# Patient Record
Sex: Female | Born: 1990 | Race: Black or African American | Hispanic: No | Marital: Single | Smoking: Never smoker
Health system: Southern US, Community
[De-identification: ages and names within clinical notes are randomized; demographics above are authoritative.]

## PROBLEM LIST (undated history)

## (undated) ENCOUNTER — Inpatient Hospital Stay (HOSPITAL_COMMUNITY): Payer: Self-pay

## (undated) DIAGNOSIS — Z789 Other specified health status: Secondary | ICD-10-CM

## (undated) HISTORY — PX: HERNIA REPAIR: SHX51

## (undated) HISTORY — DX: Other specified health status: Z78.9

---

## 2016-05-01 DIAGNOSIS — E669 Obesity, unspecified: Secondary | ICD-10-CM | POA: Insufficient documentation

## 2017-11-20 ENCOUNTER — Ambulatory Visit: Payer: Self-pay | Admitting: *Deleted

## 2017-11-20 ENCOUNTER — Encounter: Payer: Self-pay | Admitting: General Practice

## 2017-11-20 DIAGNOSIS — Z3200 Encounter for pregnancy test, result unknown: Secondary | ICD-10-CM

## 2017-11-20 DIAGNOSIS — Z3201 Encounter for pregnancy test, result positive: Secondary | ICD-10-CM

## 2017-11-20 LAB — POCT PREGNANCY, URINE: Preg Test, Ur: POSITIVE — AB

## 2017-11-20 NOTE — Progress Notes (Signed)
Pt informed of +UPT today. Medication reconciliation completed. LMP 10/04/17.  EDD 07/11/18. Pt advised to schedule prenatal care. She should return to hospital if she develops severe abdominal cramping or heavy vaginal bleeding. She voiced understanding of all information and instructions given.

## 2017-11-20 NOTE — Progress Notes (Signed)
I have reviewed the chart and agree with nursing staff's documentation of this patient's encounter.  Vonzella NippleJulie Parnika Tweten, PA-C 11/20/2017 10:10 AM

## 2018-01-01 ENCOUNTER — Ambulatory Visit (INDEPENDENT_AMBULATORY_CARE_PROVIDER_SITE_OTHER): Payer: Medicaid Other | Admitting: Obstetrics and Gynecology

## 2018-01-01 ENCOUNTER — Encounter: Payer: Self-pay | Admitting: Obstetrics and Gynecology

## 2018-01-01 ENCOUNTER — Other Ambulatory Visit (HOSPITAL_COMMUNITY)
Admission: RE | Admit: 2018-01-01 | Discharge: 2018-01-01 | Disposition: A | Discharge status: Home / Self Care | Payer: Medicaid Other | Source: Ambulatory Visit | Attending: Obstetrics and Gynecology | Admitting: Obstetrics and Gynecology

## 2018-01-01 DIAGNOSIS — O3440 Maternal care for other abnormalities of cervix, unspecified trimester: Secondary | ICD-10-CM | POA: Diagnosis not present

## 2018-01-01 DIAGNOSIS — N870 Mild cervical dysplasia: Secondary | ICD-10-CM | POA: Insufficient documentation

## 2018-01-01 DIAGNOSIS — Z3401 Encounter for supervision of normal first pregnancy, first trimester: Secondary | ICD-10-CM | POA: Diagnosis not present

## 2018-01-01 DIAGNOSIS — Z3A00 Weeks of gestation of pregnancy not specified: Secondary | ICD-10-CM | POA: Diagnosis not present

## 2018-01-01 DIAGNOSIS — Z3400 Encounter for supervision of normal first pregnancy, unspecified trimester: Secondary | ICD-10-CM | POA: Diagnosis present

## 2018-01-01 DIAGNOSIS — Z23 Encounter for immunization: Secondary | ICD-10-CM | POA: Diagnosis not present

## 2018-01-01 LAB — POCT URINALYSIS DIP (DEVICE)
BILIRUBIN URINE: NEGATIVE
Glucose, UA: NEGATIVE mg/dL
Hgb urine dipstick: NEGATIVE
Leukocytes, UA: NEGATIVE
Nitrite: NEGATIVE
PH: 7 (ref 5.0–8.0)
Protein, ur: NEGATIVE mg/dL
SPECIFIC GRAVITY, URINE: 1.015 (ref 1.005–1.030)
Urobilinogen, UA: 0.2 mg/dL (ref 0.0–1.0)

## 2018-01-01 NOTE — Progress Notes (Signed)
New OB Note  01/01/2018   CC:  Chief Complaint  Patient presents with  . Initial Prenatal Visit    Transfer of Care Patient: no  History of Present Illness: Zoe Keller is a 27 y.o. G1P0000 at [redacted]w[redacted]d by LMP being seen today for her first obstetrical visit. Her obstetrical history is significant for nothing. This was a unplanned pregnancy. Relationship with FOB: not together. Patient does intend to breast feed. Patient unsure for contraception after completion of pregnancy. Pregnancy history fully reviewed.  Her periods were: Regular She was using no method when she conceived.  She has Negative signs or symptoms of nausea/vomiting of pregnancy. She has Negative signs or symptoms of miscarriage or preterm labor  Patient reports no complaints.  ROS: A 12-point review of systems was performed and negative, except as stated in the above HPI.   OBGYN History: OB History  Gravida Para Term Preterm AB Living  1 0 0 0 0 0  SAB TAB Ectopic Multiple Live Births  0 0 0 0 0    # Outcome Date GA Lbr Len/2nd Weight Sex Delivery Anes PTL Lv  1 Current             GYN Hx: last pap smear in 2015 was normal.   Past Medical History: Past Medical History:  Diagnosis Date  . Medical history non-contributory     Past Surgical History: Past Surgical History:  Procedure Laterality Date  . HERNIA REPAIR      Family History:  Family History  Problem Relation Age of Onset  . Diabetes Maternal Grandmother     Social History:  Social History   Socioeconomic History  . Marital status: Single    Spouse name: Not on file  . Number of children: Not on file  . Years of education: Not on file  . Highest education level: Not on file  Occupational History  . Not on file  Social Needs  . Financial resource strain: Not on file  . Food insecurity:    Worry: Not on file    Inability: Not on file  . Transportation needs:    Medical: Not on file    Non-medical: Not on file    Tobacco Use  . Smoking status: Never Smoker  . Smokeless tobacco: Never Used  Substance and Sexual Activity  . Alcohol use: Never    Frequency: Never  . Drug use: Never  . Sexual activity: Not Currently    Comment: [employer]  Lifestyle  . Physical activity:    Days per week: Not on file    Minutes per session: Not on file  . Stress: Not on file  Relationships  . Social connections:    Talks on phone: Not on file    Gets together: Not on file    Attends religious service: Not on file    Active member of club or organization: Not on file    Attends meetings of clubs or organizations: Not on file    Relationship status: Not on file  . Intimate partner violence:    Fear of current or ex partner: Not on file    Emotionally abused: Not on file    Physically abused: Not on file    Forced sexual activity: Not on file  Other Topics Concern  . Not on file  Social History Narrative  . Not on file    Allergy: Not on File  Current Outpatient Medications:  Current Outpatient Medications:  .  Prenatal Vit-Fe Fumarate-FA (MULTIVITAMIN-PRENATAL) 27-0.8 MG TABS tablet, Take 1 tablet by mouth daily at 12 noon., Disp: , Rfl:   Physical Exam:   BP 114/63   Pulse 87   Ht 5\' 3"  (1.6 m)   Wt 75.4 kg (166 lb 4.8 oz)   LMP 10/04/2017 (Exact Date)   BMI 29.46 kg/m  Body mass index is 29.46 kg/m. Contractions: Not present Vag. Bleeding: None. FHTs: 156  General appearance: Well nourished, well developed female in no acute distress.  Neck:  Supple, normal appearance, and no thyromegaly  Cardiovascular: regular rate and rhythm Respiratory:  Clear to auscultation bilateral. Normal respiratory effort Abdomen: positive bowel sounds and no masses, hernias; diffusely non tender to palpation, non distended Breasts: breasts appear normal, no suspicious masses, no skin or nipple changes or axillary nodes. Genitalia:  Normal introitus for age, no external lesions, no vaginal discharge, mucosa  pink and moist, no vaginal or cervical lesions, no vaginal atrophy, no friaility or hemorrhage, normal uterus size and position, no adnexal masses or tenderness Neuro/Psych:  Normal mood and affect.  Skin:  Warm and dry.  Lymphatic:  No inguinal lymphadenopathy.    Assessment/Plan: G1P0000 2084w5d  1. Supervision of normal first pregnancy, antepartum - [facility] AMB BABYSCRIPTS OPT IN - Culture, OB Urine - Cystic fibrosis gene test - Cytology - PAP - Hemoglobinopathy Evaluation - Obstetric Panel, Including HIV - SMN1 COPY NUMBER ANALYSIS (SMA Carrier Screen) - Flu Vaccine QUAD 36+ mos IM  Initial labs drawn. Continue prenatal vitamins. Genetic Screening discussed: ordered. Ultrasound discussed; fetal anatomic survey: requested. Will need to place order at next visit. Problem list reviewed and updated. [facility] with multiple MDs and other Advanced Practice Providers was explained to patient; also emphasized that residents, students are part of our team. Routine obstetric precautions reviewed. Return in about 1 month (around 01/29/2018) for ob visit.    Caryl AdaJazma Phelps, DO OB Fellow [facility]

## 2018-01-01 NOTE — Patient Instructions (Signed)
First Trimester of Pregnancy The first trimester of pregnancy is from week 1 until the end of week 13 (months 1 through 3). A week after a sperm fertilizes an egg, the egg will implant on the wall of the uterus. This embryo will begin to develop into a baby. Genes from you and your partner will form the baby. The female genes will determine whether the baby will be a boy or a girl. At 6-8 weeks, the eyes and face will be formed, and the heartbeat can be seen on ultrasound. At the end of 12 weeks, all the baby's organs will be formed. Now that you are pregnant, you will want to do everything you can to have a healthy baby. Two of the most important things are to get good prenatal care and to follow your health care provider's instructions. Prenatal care is all the medical care you receive before the baby's birth. This care will help prevent, find, and treat any problems during the pregnancy and childbirth. Body changes during your first trimester Your body goes through many changes during pregnancy. The changes vary from woman to woman.  You may gain or lose a couple of pounds at first.  You may feel sick to your stomach (nauseous) and you may throw up (vomit). If the vomiting is uncontrollable, call your health care provider.  You may tire easily.  You may develop headaches that can be relieved by medicines. All medicines should be approved by your health care provider.  You may urinate more often. Painful urination may mean you have a bladder infection.  You may develop heartburn as a result of your pregnancy.  You may develop constipation because certain hormones are causing the muscles that push stool through your intestines to slow down.  You may develop hemorrhoids or swollen veins (varicose veins).  Your breasts may begin to grow larger and become tender. Your nipples may stick out more, and the tissue that surrounds them (areola) may become darker.  Your gums may bleed and may be  sensitive to brushing and flossing.  Dark spots or blotches (chloasma, mask of pregnancy) may develop on your face. This will likely fade after the baby is born.  Your menstrual periods will stop.  You may have a loss of appetite.  You may develop cravings for certain kinds of food.  You may have changes in your emotions from day to day, such as being excited to be pregnant or being concerned that something may go wrong with the pregnancy and baby.  You may have more vivid and strange dreams.  You may have changes in your hair. These can include thickening of your hair, rapid growth, and changes in texture. Some women also have hair loss during or after pregnancy, or hair that feels dry or thin. Your hair will most likely return to normal after your baby is born.  What to expect at prenatal visits During a routine prenatal visit:  You will be weighed to make sure you and the baby are growing normally.  Your blood pressure will be taken.  Your abdomen will be measured to track your baby's growth.  The fetal heartbeat will be listened to between weeks 10 and 14 of your pregnancy.  Test results from any previous visits will be discussed.  Your health care provider may ask you:  How you are feeling.  If you are feeling the baby move.  If you have had any abnormal symptoms, such as leaking fluid, bleeding, severe headaches,   or abdominal cramping.  If you are using any tobacco products, including cigarettes, chewing tobacco, and electronic cigarettes.  If you have any questions.  Other tests that may be performed during your first trimester include:  Blood tests to find your blood type and to check for the presence of any previous infections. The tests will also be used to check for low iron levels (anemia) and protein on red blood cells (Rh antibodies). Depending on your risk factors, or if you previously had diabetes during pregnancy, you may have tests to check for high blood  sugar that affects pregnant women (gestational diabetes).  Urine tests to check for infections, diabetes, or protein in the urine.  An ultrasound to confirm the proper growth and development of the baby.  Fetal screens for spinal cord problems (spina bifida) and Down syndrome.  HIV (human immunodeficiency virus) testing. Routine prenatal testing includes screening for HIV, unless you choose not to have this test.  You may need other tests to make sure you and the baby are doing well.  Follow these instructions at home: Medicines  Follow your health care provider's instructions regarding medicine use. Specific medicines may be either safe or unsafe to take during pregnancy.  Take a prenatal vitamin that contains at least 600 micrograms (mcg) of folic acid.  If you develop constipation, try taking a stool softener if your health care provider approves. Eating and drinking  Eat a balanced diet that includes fresh fruits and vegetables, whole grains, good sources of protein such as meat, eggs, or tofu, and low-fat dairy. Your health care provider will help you determine the amount of weight gain that is right for you.  Avoid raw meat and uncooked cheese. These carry germs that can cause birth defects in the baby.  Eating four or five small meals rather than three large meals a day may help relieve nausea and vomiting. If you start to feel nauseous, eating a few soda crackers can be helpful. Drinking liquids between meals, instead of during meals, also seems to help ease nausea and vomiting.  Limit foods that are high in fat and processed sugars, such as fried and sweet foods.  To prevent constipation: ? Eat foods that are high in fiber, such as fresh fruits and vegetables, whole grains, and beans. ? Drink enough fluid to keep your urine clear or pale yellow. Activity  Exercise only as directed by your health care provider. Most women can continue their usual exercise routine during  pregnancy. Try to exercise for 30 minutes at least 5 days a week. Exercising will help you: ? Control your weight. ? Stay in shape. ? Be prepared for labor and delivery.  Experiencing pain or cramping in the lower abdomen or lower back is a good sign that you should stop exercising. Check with your health care provider before continuing with normal exercises.  Try to avoid standing for long periods of time. Move your legs often if you must stand in one place for a long time.  Avoid heavy lifting.  Wear low-heeled shoes and practice good posture.  You may continue to have sex unless your health care provider tells you not to. Relieving pain and discomfort  Wear a good support bra to relieve breast tenderness.  Take warm sitz baths to soothe any pain or discomfort caused by hemorrhoids. Use hemorrhoid cream if your health care provider approves.  Rest with your legs elevated if you have leg cramps or low back pain.  If you develop   varicose veins in your legs, wear support hose. Elevate your feet for 15 minutes, 3-4 times a day. Limit salt in your diet. Prenatal care  Schedule your prenatal visits by the twelfth week of pregnancy. They are usually scheduled monthly at first, then more often in the last 2 months before delivery.  Write down your questions. Take them to your prenatal visits.  Keep all your prenatal visits as told by your health care provider. This is important. Safety  Wear your seat belt at all times when driving.  Make a list of emergency phone numbers, including numbers for family, friends, the hospital, and police and fire departments. General instructions  Ask your health care provider for a referral to a local prenatal education class. Begin classes no later than the beginning of month 6 of your pregnancy.  Ask for help if you have counseling or nutritional needs during pregnancy. Your health care provider can offer advice or refer you to specialists for help  with various needs.  Do not use hot tubs, steam rooms, or saunas.  Do not douche or use tampons or scented sanitary pads.  Do not cross your legs for long periods of time.  Avoid cat litter boxes and soil used by cats. These carry germs that can cause birth defects in the baby and possibly loss of the fetus by miscarriage or stillbirth.  Avoid all smoking, herbs, alcohol, and medicines not prescribed by your health care provider. Chemicals in these products affect the formation and growth of the baby.  Do not use any products that contain nicotine or tobacco, such as cigarettes and e-cigarettes. If you need help quitting, ask your health care provider. You may receive counseling support and other resources to help you quit.  Schedule a dentist appointment. At home, brush your teeth with a soft toothbrush and be gentle when you floss. Contact a health care provider if:  You have dizziness.  You have mild pelvic cramps, pelvic pressure, or nagging pain in the abdominal area.  You have persistent nausea, vomiting, or diarrhea.  You have a bad smelling vaginal discharge.  You have pain when you urinate.  You notice increased swelling in your face, hands, legs, or ankles.  You are exposed to fifth disease or chickenpox.  You are exposed to German measles (rubella) and have never had it. Get help right away if:  You have a fever.  You are leaking fluid from your vagina.  You have spotting or bleeding from your vagina.  You have severe abdominal cramping or pain.  You have rapid weight gain or loss.  You vomit blood or material that looks like coffee grounds.  You develop a severe headache.  You have shortness of breath.  You have any kind of trauma, such as from a fall or a car accident. Summary  The first trimester of pregnancy is from week 1 until the end of week 13 (months 1 through 3).  Your body goes through many changes during pregnancy. The changes vary from  woman to woman.  You will have routine prenatal visits. During those visits, your health care provider will examine you, discuss any test results you may have, and talk with you about how you are feeling. This information is not intended to replace advice given to you by your health care provider. Make sure you discuss any questions you have with your health care provider. Document Released: 08/22/2001 Document Revised: 08/09/2016 Document Reviewed: 08/09/2016 Elsevier Interactive Patient Education  2018 [employer]  Safe Medications in Pregnancy   Acne: Benzoyl Peroxide Salicylic Acid  Backache/Headache: Tylenol: 2 regular strength every 4 hours OR              2 Extra strength every 6 hours  Colds/Coughs/Allergies: Benadryl (alcohol free) 25 mg every 6 hours as needed Breath right strips Claritin Cepacol throat lozenges Chloraseptic throat spray Cold-Eeze- up to three times per day Cough drops, alcohol free Flonase (by prescription only) Guaifenesin Mucinex Robitussin DM (plain only, alcohol free) Saline nasal spray/drops Sudafed (pseudoephedrine) & Actifed ** use only after [redacted] weeks gestation and if you do not have high blood pressure Tylenol Vicks Vaporub Zinc lozenges Zyrtec   Constipation: Colace Ducolax suppositories Fleet enema Glycerin suppositories Metamucil Milk of magnesia Miralax Senokot Smooth move tea  Diarrhea: Kaopectate Imodium A-D  *NO pepto Bismol  Hemorrhoids: Anusol Anusol HC Preparation H Tucks  Indigestion: Tums Maalox Mylanta Zantac  Pepcid  Insomnia: Benadryl (alcohol free) 25mg  every 6 hours as needed Tylenol PM Unisom, no Gelcaps  Leg Cramps: Tums MagGel  Nausea/Vomiting:  Bonine Dramamine Emetrol Ginger extract Sea bands Meclizine  Nausea medication to take during pregnancy:  Unisom (doxylamine succinate 25 mg tablets) Take one tablet daily at bedtime. If symptoms are not adequately controlled, the dose  can be increased to a maximum recommended dose of two tablets daily (1/2 tablet in the morning, 1/2 tablet mid-afternoon and one at bedtime). Vitamin B6 100mg  tablets. Take one tablet twice a day (up to 200 mg per day).  Skin Rashes: Aveeno products Benadryl cream or 25mg  every 6 hours as needed Calamine Lotion 1% cortisone cream  Yeast infection: Gyne-lotrimin 7 Monistat 7   **If taking multiple medications, please check labels to avoid duplicating the same active ingredients **take medication as directed on the label ** Do not exceed 4000 mg of tylenol in 24 hours **Do not take medications that contain aspirin or ibuprofen   Contraception Choices Contraception, also called birth control, refers to methods or devices that prevent pregnancy. Hormonal methods Contraceptive implant A contraceptive implant is a thin, plastic tube that contains a hormone. It is inserted into the upper part of the arm. It can remain in place for up to 3 years. Progestin-only injections Progestin-only injections are injections of progestin, a synthetic form of the hormone progesterone. They are given every 3 months by a health care provider. Birth control pills Birth control pills are pills that contain hormones that prevent pregnancy. They must be taken once a day, preferably at the same time each day. Birth control patch The birth control patch contains hormones that prevent pregnancy. It is placed on the skin and must be changed once a week for three weeks and removed on the fourth week. A prescription is needed to use this method of contraception. Vaginal ring A vaginal ring contains hormones that prevent pregnancy. It is placed in the vagina for three weeks and removed on the fourth week. After that, the process is repeated with a new ring. A prescription is needed to use this method of contraception. Emergency contraceptive Emergency contraceptives prevent pregnancy after unprotected sex. They come  in pill form and can be taken up to 5 days after sex. They work best the sooner they are taken after having sex. Most emergency contraceptives are available without a prescription. This method should not be used as your only form of birth control. Barrier methods Female condom A female condom is a thin sheath that is worn over the penis  during sex. Condoms keep sperm from going inside a woman's body. They can be used with a spermicide to increase their effectiveness. They should be disposed after a single use. Female condom A female condom is a soft, loose-fitting sheath that is put into the vagina before sex. The condom keeps sperm from going inside a woman's body. They should be disposed after a single use. Diaphragm A diaphragm is a soft, dome-shaped barrier. It is inserted into the vagina before sex, along with a spermicide. The diaphragm blocks sperm from entering the uterus, and the spermicide kills sperm. A diaphragm should be left in the vagina for 6-8 hours after sex and removed within 24 hours. A diaphragm is prescribed and fitted by a health care provider. A diaphragm should be replaced every 1-2 years, after giving birth, after gaining more than 15 lb (6.8 kg), and after pelvic surgery. Cervical cap A cervical cap is a round, soft latex or plastic cup that fits over the cervix. It is inserted into the vagina before sex, along with spermicide. It blocks sperm from entering the uterus. The cap should be left in place for 6-8 hours after sex and removed within 48 hours. A cervical cap must be prescribed and fitted by a health care provider. It should be replaced every 2 years. Sponge A sponge is a soft, circular piece of polyurethane foam with spermicide on it. The sponge helps block sperm from entering the uterus, and the spermicide kills sperm. To use it, you make it wet and then insert it into the vagina. It should be inserted before sex, left in for at least 6 hours after sex, and removed and  thrown away within 30 hours. Spermicides Spermicides are chemicals that kill or block sperm from entering the cervix and uterus. They can come as a cream, jelly, suppository, foam, or tablet. A spermicide should be inserted into the vagina with an applicator at least 10-15 minutes before sex to allow time for it to work. The process must be repeated every time you have sex. Spermicides do not require a prescription. Intrauterine contraception Intrauterine device (IUD) An IUD is a T-shaped device that is put in a woman's uterus. There are two types:  Hormone IUD.This type contains progestin, a synthetic form of the hormone progesterone. This type can stay in place for 3-5 years.  Copper IUD.This type is wrapped in copper wire. It can stay in place for 10 years.  Permanent methods of contraception Female tubal ligation In this method, a woman's fallopian tubes are sealed, tied, or blocked during surgery to prevent eggs from traveling to the uterus. Hysteroscopic sterilization In this method, a small, flexible insert is placed into each fallopian tube. The inserts cause scar tissue to form in the fallopian tubes and block them, so sperm cannot reach an egg. The procedure takes about 3 months to be effective. Another form of birth control must be used during those 3 months. Female sterilization This is a procedure to tie off the tubes that carry sperm (vasectomy). After the procedure, the man can still ejaculate fluid (semen). Natural planning methods Natural family planning In this method, a couple does not have sex on days when the woman could become pregnant. Calendar method This means keeping track of the length of each menstrual cycle, identifying the days when pregnancy can happen, and not having sex on those days. Ovulation method In this method, a couple avoids sex during ovulation. Symptothermal method This method involves not having sex during ovulation.  The woman typically checks for  ovulation by watching changes in her temperature and in the consistency of cervical mucus. Post-ovulation method In this method, a couple waits to have sex until after ovulation. Summary  Contraception, also called birth control, means methods or devices that prevent pregnancy.  Hormonal methods of contraception include implants, injections, pills, patches, vaginal rings, and emergency contraceptives.  Barrier methods of contraception can include female condoms, female condoms, diaphragms, cervical caps, sponges, and spermicides.  There are two types of IUDs (intrauterine devices). An IUD can be put in a woman's uterus to prevent pregnancy for 3-5 years.  Permanent sterilization can be done through a procedure for males, females, or both.  Natural family planning methods involve not having sex on days when the woman could become pregnant. This information is not intended to replace advice given to you by your health care provider. Make sure you discuss any questions you have with your health care provider. Document Released: 08/28/2005 Document Revised: 09/30/2016 Document Reviewed: 09/30/2016 Elsevier Interactive Patient Education  2018 [employer]

## 2018-01-04 LAB — URINE CULTURE, OB REFLEX: Organism ID, Bacteria: NO GROWTH

## 2018-01-04 LAB — CYTOLOGY - PAP
Chlamydia: NEGATIVE
Neisseria Gonorrhea: NEGATIVE

## 2018-01-04 LAB — CULTURE, OB URINE

## 2018-01-08 ENCOUNTER — Encounter: Payer: Self-pay | Admitting: Obstetrics and Gynecology

## 2018-01-08 DIAGNOSIS — R87612 Low grade squamous intraepithelial lesion on cytologic smear of cervix (LGSIL): Secondary | ICD-10-CM | POA: Insufficient documentation

## 2018-01-09 LAB — SMN1 COPY NUMBER ANALYSIS (SMA CARRIER SCREENING)

## 2018-01-09 LAB — OBSTETRIC PANEL, INCLUDING HIV
Antibody Screen: NEGATIVE
Basophils Absolute: 0 10*3/uL (ref 0.0–0.2)
Basos: 0 %
EOS (ABSOLUTE): 0.1 10*3/uL (ref 0.0–0.4)
EOS: 1 %
HIV Screen 4th Generation wRfx: NONREACTIVE
Hematocrit: 36 % (ref 34.0–46.6)
Hemoglobin: 12.1 g/dL (ref 11.1–15.9)
Hepatitis B Surface Ag: NEGATIVE
IMMATURE GRANULOCYTES: 1 %
Immature Grans (Abs): 0.1 10*3/uL (ref 0.0–0.1)
LYMPHS: 17 %
Lymphocytes Absolute: 1.7 10*3/uL (ref 0.7–3.1)
MCH: 31 pg (ref 26.6–33.0)
MCHC: 33.6 g/dL (ref 31.5–35.7)
MCV: 92 fL (ref 79–97)
MONOS ABS: 0.6 10*3/uL (ref 0.1–0.9)
Monocytes: 6 %
NEUTROS PCT: 75 %
Neutrophils Absolute: 7.6 10*3/uL — ABNORMAL HIGH (ref 1.4–7.0)
PLATELETS: 396 10*3/uL — AB (ref 150–379)
RBC: 3.9 x10E6/uL (ref 3.77–5.28)
RDW: 13.4 % (ref 12.3–15.4)
RH TYPE: POSITIVE
RPR Ser Ql: NONREACTIVE
Rubella Antibodies, IGG: 3.11 index (ref >0.99)
WBC: 10.1 10*3/uL (ref 3.4–10.8)

## 2018-01-09 LAB — HEMOGLOBINOPATHY EVALUATION
Ferritin: 75 ng/mL (ref 15–150)
HGB A2 QUANT: 2.7 % (ref 1.8–3.2)
HGB A: 97.3 % (ref 96.4–98.8)
HGB C: 0 %
HGB S: 0 %
HGB VARIANT: 0 %
Hgb F Quant: 0 % (ref 0.0–2.0)
Hgb Solubility: NEGATIVE

## 2018-01-09 LAB — CYSTIC FIBROSIS GENE TEST

## 2018-01-10 ENCOUNTER — Encounter: Payer: Self-pay | Admitting: *Deleted

## 2018-01-16 ENCOUNTER — Encounter: Payer: Self-pay | Admitting: *Deleted

## 2018-01-25 ENCOUNTER — Other Ambulatory Visit: Payer: Self-pay

## 2018-01-25 ENCOUNTER — Encounter (HOSPITAL_COMMUNITY): Payer: Self-pay

## 2018-01-25 ENCOUNTER — Inpatient Hospital Stay (HOSPITAL_COMMUNITY)
Admission: AD | Admit: 2018-01-25 | Discharge: 2018-01-25 | Disposition: A | Discharge status: Home / Self Care | Payer: Medicaid Other | Source: Ambulatory Visit | Attending: Obstetrics and Gynecology | Admitting: Obstetrics and Gynecology

## 2018-01-25 DIAGNOSIS — R109 Unspecified abdominal pain: Secondary | ICD-10-CM | POA: Diagnosis not present

## 2018-01-25 DIAGNOSIS — Z3A16 16 weeks gestation of pregnancy: Secondary | ICD-10-CM | POA: Diagnosis not present

## 2018-01-25 DIAGNOSIS — O26892 Other specified pregnancy related conditions, second trimester: Secondary | ICD-10-CM | POA: Diagnosis not present

## 2018-01-25 DIAGNOSIS — N949 Unspecified condition associated with female genital organs and menstrual cycle: Secondary | ICD-10-CM | POA: Diagnosis not present

## 2018-01-25 LAB — URINALYSIS, ROUTINE W REFLEX MICROSCOPIC
BILIRUBIN URINE: NEGATIVE
GLUCOSE, UA: NEGATIVE mg/dL
Hgb urine dipstick: NEGATIVE
KETONES UR: NEGATIVE mg/dL
Leukocytes, UA: NEGATIVE
Nitrite: NEGATIVE
Protein, ur: NEGATIVE mg/dL
Specific Gravity, Urine: 1.032 — ABNORMAL HIGH (ref 1.005–1.030)
pH: 6 (ref 5.0–8.0)

## 2018-01-25 NOTE — Progress Notes (Addendum)
  G1 @ 16.[redacted] wksga. Here dt to pain on the left side 2/10 that comes and goes. Denies bleeding or LOF. States feels some flutter "but unsure"   1131: Provider at bs assessing.   1140: d/c orders received.  D/c instructions given with pt understanding. Pt left unit via ambulatory.

## 2018-01-25 NOTE — Discharge Instructions (Signed)

## 2018-01-25 NOTE — [facility]U Provider Note (Signed)
History     CSN: [id number]  Arrival date and time: 01/25/18 1030   First Provider Initiated Contact with Patient 01/25/18 1130      Chief Complaint  Patient presents with  . Abdominal Pain   HPI Zoe Keller is a 27 y.o. G1P0000 at [redacted]w[redacted]d who presents to [facility]U today with complaint of LLQ abdominal pain off and on x 1 week. She states that it is mild and feels more like pressure with standing and walking. Pain is often relieved with rest. She denies vaginal bleeding, discharge, fever or UTI symptoms. She has not taken any pain medications.   OB History    Gravida  1   Para  0   Term  0   Preterm  0   AB  0   Living  0     SAB  0   TAB  0   Ectopic  0   Multiple  0   Live Births  0           Past Medical History:  Diagnosis Date  . Medical history non-contributory     Past Surgical History:  Procedure Laterality Date  . HERNIA REPAIR      Family History  Problem Relation Age of Onset  . Diabetes Maternal Grandmother     Social History   Tobacco Use  . Smoking status: Never Smoker  . Smokeless tobacco: Never Used  Substance Use Topics  . Alcohol use: Never    Frequency: Never  . Drug use: Never    Allergies: No Known Allergies  Medications Prior to Admission  Medication Sig Dispense Refill Last Dose  . Prenatal Vit-Fe Fumarate-FA (MULTIVITAMIN-PRENATAL) 27-0.8 MG TABS tablet Take 1 tablet by mouth daily at 12 noon.   Taking    Review of Systems  Constitutional: Negative for fever.  Gastrointestinal: Positive for abdominal pain. Negative for constipation, diarrhea, nausea and vomiting.  Genitourinary: Negative for dysuria, frequency, urgency, vaginal bleeding and vaginal discharge.   Physical Exam   Blood pressure 120/66, pulse 90, temperature 98 F (36.7 C), temperature source Oral, resp. rate 18, height  (1.6 m), weight 173 lb (78.5 kg), last menstrual period 10/04/2017, SpO2 99 %.  Physical Exam  Nursing note and  vitals reviewed. Constitutional: She is oriented to person, place, and time. She appears well-developed and well-nourished. No distress.  HENT:  Head: Normocephalic and atraumatic.  Cardiovascular: Normal rate.  Respiratory: Effort normal.  GI: Soft. She exhibits no distension and no mass. There is no tenderness. There is no rebound and no guarding.  Neurological: She is alert and oriented to person, place, and time.  Skin: Skin is warm and dry. No erythema.  Psychiatric: She has a normal mood and affect.  Dilation: Closed Effacement (%): Thick Cervical Position: Posterior Exam by:: Vonzella Nipple, PA-C   Results for orders placed or performed during the hospital encounter of 01/25/18 (from the past 24 hour(s))  Urinalysis, Routine w reflex microscopic     Status: Abnormal   Collection Time: 01/25/18 10:40 AM  Result Value Ref Range   Color, Urine YELLOW YELLOW   APPearance HAZY (A) CLEAR   Specific Gravity, Urine 1.032 (H) 1.005 - 1.030   pH 6.0 5.0 - 8.0   Glucose, UA NEGATIVE NEGATIVE mg/dL   Hgb urine dipstick NEGATIVE NEGATIVE   Bilirubin Urine NEGATIVE NEGATIVE   Ketones, ur NEGATIVE NEGATIVE mg/dL   Protein, ur NEGATIVE NEGATIVE mg/dL   Nitrite NEGATIVE NEGATIVE   Leukocytes,  UA NEGATIVE NEGATIVE    MAU Course  Procedures None  MDM FHR - 148 bpm with doppler  UA today  Assessment and Plan  A: [facility]UP at [redacted]w[redacted]d Round ligament pain   P: Discharge home Tylenol PRN for pain advised  Discussed abdominal binder, moderation of activity and warm bath/shower or hydrotherapy  Second trimester precautions discussed Patient advised to follow-up with [facility] for routine prenatal care or sooner PRN  Patient may return to MAU as needed or if her condition were to change or worsen  Vonzella Nipple, PA-C 01/25/2018, 11:30 AM

## 2018-01-25 NOTE — MAU Note (Signed)
Urine in lab 

## 2018-02-01 ENCOUNTER — Other Ambulatory Visit: Payer: Self-pay

## 2018-02-01 ENCOUNTER — Ambulatory Visit (INDEPENDENT_AMBULATORY_CARE_PROVIDER_SITE_OTHER): Payer: Medicaid Other | Admitting: Obstetrics and Gynecology

## 2018-02-01 VITALS — BP 109/69 | HR 99 | Wt 176.0 lb

## 2018-02-01 DIAGNOSIS — R87612 Low grade squamous intraepithelial lesion on cytologic smear of cervix (LGSIL): Secondary | ICD-10-CM

## 2018-02-01 DIAGNOSIS — O2602 Excessive weight gain in pregnancy, second trimester: Secondary | ICD-10-CM | POA: Insufficient documentation

## 2018-02-01 DIAGNOSIS — Z3400 Encounter for supervision of normal first pregnancy, unspecified trimester: Secondary | ICD-10-CM

## 2018-02-01 NOTE — Patient Instructions (Signed)
Second Trimester of Pregnancy The second trimester is from week 13 through week 28, month 4 through 6. This is often the time in pregnancy that you feel your best. Often times, morning sickness has lessened or quit. You may have more energy, and you may get hungry more often. Your unborn baby (fetus) is growing rapidly. At the end of the sixth month, he or she is about 9 inches long and weighs about 1 pounds. You will likely feel the baby move (quickening) between 18 and 20 weeks of pregnancy. Follow these instructions at home:  Avoid all smoking, herbs, and alcohol. Avoid drugs not approved by your doctor.  Do not use any tobacco products, including cigarettes, chewing tobacco, and electronic cigarettes. If you need help quitting, ask your doctor. You may get counseling or other support to help you quit.  Only take medicine as told by your doctor. Some medicines are safe and some are not during pregnancy.  Exercise only as told by your doctor. Stop exercising if you start having cramps.  Eat regular, healthy meals.  Wear a good support bra if your breasts are tender.  Do not use hot tubs, steam rooms, or saunas.  Wear your seat belt when driving.  Avoid raw meat, uncooked cheese, and liter boxes and soil used by cats.  Take your prenatal vitamins.  Take 1500-2000 milligrams of calcium daily starting at the 20th week of pregnancy until you deliver your baby.  Try taking medicine that helps you poop (stool softener) as needed, and if your doctor approves. Eat more fiber by eating fresh fruit, vegetables, and whole grains. Drink enough fluids to keep your pee (urine) clear or pale yellow.  Take warm water baths (sitz baths) to soothe pain or discomfort caused by hemorrhoids. Use hemorrhoid cream if your doctor approves.  If you have puffy, bulging veins (varicose veins), wear support hose. Raise (elevate) your feet for 15 minutes, 3-4 times a day. Limit salt in your diet.  Avoid heavy  lifting, wear low heals, and sit up straight.  Rest with your legs raised if you have leg cramps or low back pain.  Visit your dentist if you have not gone during your pregnancy. Use a soft toothbrush to brush your teeth. Be gentle when you floss.  You can have sex (intercourse) unless your doctor tells you not to.  Go to your doctor visits. Get help if:  You feel dizzy.  You have mild cramps or pressure in your lower belly (abdomen).  You have a nagging pain in your belly area.  You continue to feel sick to your stomach (nauseous), throw up (vomit), or have watery poop (diarrhea).  You have bad smelling fluid coming from your vagina.  You have pain with peeing (urination). Get help right away if:  You have a fever.  You are leaking fluid from your vagina.  You have spotting or bleeding from your vagina.  You have severe belly cramping or pain.  You lose or gain weight rapidly.  You have trouble catching your breath and have chest pain.  You notice sudden or extreme puffiness (swelling) of your face, hands, ankles, feet, or legs.  You have not felt the baby move in over an hour.  You have severe headaches that do not go away with medicine.  You have vision changes. This information is not intended to replace advice given to you by your health care provider. Make sure you discuss any questions you have with your health care   provider. Document Released: 11/22/2009 Document Revised: 02/03/2016 Document Reviewed: 10/29/2012 Elsevier Interactive Patient Education  2017 [employer]  

## 2018-02-01 NOTE — Progress Notes (Signed)
Subjective:  Zoe Keller is a 27 y.o. G1P0000 at [redacted]w[redacted]d being seen today for ongoing prenatal care.  She is currently monitored for the following issues for this low-risk pregnancy and has Supervision of normal first pregnancy, antepartum and Low grade squamous intraepithelial lesion (LGSIL) on cervical Pap smear on their problem list.  Patient reports no complaints.  Contractions: Not present. Vag. Bleeding: None.  Movement: Present. Denies leaking of fluid.   The following portions of the patient's history were reviewed and updated as appropriate: allergies, current medications, past family history, past medical history, past social history, past surgical history and problem list. Problem list updated.  Objective:   Vitals:   02/01/18 1008  BP: 109/69  Pulse: 99  Weight: 176 lb (79.8 kg)    Fetal Status: Fetal Heart Rate (bpm): 154   Movement: Present     General:  Alert, oriented and cooperative. Patient is in no acute distress.  Skin: Skin is warm and dry. No rash noted.   Cardiovascular: Normal heart rate noted  Respiratory: Normal respiratory effort, no problems with respiration noted  Abdomen: Soft, gravid, appropriate for gestational age. Pain/Pressure: Absent     Pelvic: Vag. Bleeding: None     Cervical exam deferred        Extremities: Normal range of motion.  Edema: None  Mental Status: Normal mood and affect. Normal behavior. Normal judgment and thought content.   Urinalysis:      Assessment and Plan:  Pregnancy: G1P0000 at [redacted]w[redacted]d  1. Supervision of normal first pregnancy, antepartum Doing well. Routine care  2. Low grade squamous intraepithelial lesion (LGSIL) on cervical Pap smear PP colposcopy needed  3. Excessive weight gain during pregnancy in second trimester Discussed 10lb weight gain in 4 weeks and overall 26lb TWG this pregnancy. Encouraged cardio and healthy eating. Continue to monitor.    General obstetric precautions were reviewed in detail with  the patient. Please refer to After Visit Summary for other counseling recommendations.  Return in about 1 month (around 03/01/2018) for ob visit.   Pincus Large, DO

## 2018-02-07 ENCOUNTER — Encounter (HOSPITAL_COMMUNITY): Payer: Self-pay

## 2018-02-15 ENCOUNTER — Other Ambulatory Visit: Payer: Self-pay | Admitting: Obstetrics and Gynecology

## 2018-02-15 ENCOUNTER — Ambulatory Visit (HOSPITAL_COMMUNITY)
Admission: RE | Admit: 2018-02-15 | Discharge: 2018-02-15 | Disposition: A | Discharge status: Home / Self Care | Payer: Medicaid Other | Source: Ambulatory Visit | Attending: Obstetrics and Gynecology | Admitting: Obstetrics and Gynecology

## 2018-02-15 DIAGNOSIS — Z3402 Encounter for supervision of normal first pregnancy, second trimester: Secondary | ICD-10-CM | POA: Insufficient documentation

## 2018-02-15 DIAGNOSIS — Z3400 Encounter for supervision of normal first pregnancy, unspecified trimester: Secondary | ICD-10-CM

## 2018-02-15 DIAGNOSIS — Z363 Encounter for antenatal screening for malformations: Secondary | ICD-10-CM | POA: Insufficient documentation

## 2018-02-15 DIAGNOSIS — Z3A19 19 weeks gestation of pregnancy: Secondary | ICD-10-CM

## 2018-03-01 ENCOUNTER — Ambulatory Visit (INDEPENDENT_AMBULATORY_CARE_PROVIDER_SITE_OTHER): Payer: Medicaid Other | Admitting: Advanced Practice Midwife

## 2018-03-01 VITALS — BP 126/71 | HR 81 | Wt 188.0 lb

## 2018-03-01 DIAGNOSIS — R87612 Low grade squamous intraepithelial lesion on cytologic smear of cervix (LGSIL): Secondary | ICD-10-CM

## 2018-03-01 DIAGNOSIS — Z3400 Encounter for supervision of normal first pregnancy, unspecified trimester: Secondary | ICD-10-CM

## 2018-03-01 DIAGNOSIS — Z3402 Encounter for supervision of normal first pregnancy, second trimester: Secondary | ICD-10-CM

## 2018-03-01 DIAGNOSIS — O2602 Excessive weight gain in pregnancy, second trimester: Secondary | ICD-10-CM

## 2018-03-01 NOTE — Progress Notes (Addendum)
   PRENATAL VISIT NOTE  Subjective:  Zoe Keller is a 27 y.o. G1P0000 at 6193w1d being seen today for ongoing prenatal care.  She is currently monitored for the following issues for this low-risk pregnancy and has Supervision of normal first pregnancy, antepartum; Low grade squamous intraepithelial lesion (LGSIL) on cervical Pap smear; and Excessive weight gain during pregnancy in second trimester on their problem list.  Patient reports no bleeding, no contractions, no cramping and no leaking.  Contractions: Not present. Vag. Bleeding: None.  Movement: Present. Denies leaking of fluid.   The following portions of the patient's history were reviewed and updated as appropriate: allergies, current medications, past family history, past medical history, past social history, past surgical history and problem list. Problem list updated.  Objective:   Vitals:   03/01/18 0829  BP: 126/71  Pulse: 81  Weight: 188 lb (85.3 kg)    Fetal Status: Fetal Heart Rate (bpm): 145   Movement: Present     General:  Alert, oriented and cooperative. Patient is in no acute distress.  Skin: Skin is warm and dry. No rash noted.   Cardiovascular: Normal heart rate noted  Respiratory: Normal respiratory effort, no problems with respiration noted  Abdomen: Soft, gravid, appropriate for gestational age.  Pain/Pressure: Absent     Pelvic: Cervical exam deferred        Extremities: Normal range of motion.  Edema: None  Mental Status: Normal mood and affect. Normal behavior. Normal judgment and thought content.   Assessment and Plan:  Pregnancy: G1P0000 at 1893w1d  1. 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 Low grade squamous  intraepithelial lesion (LGSIL) on cervical Pap smear -Postpartum colpo  3. Excessive weight gain during pregnancy in second trimester -188lbs today, weight up 22 lbs from initial OB visit 01/01/18, 11 lb gain from 4 weeks ago -Reviewed diet plan with protein first, 64 oz minimum daily water intake - Discussed impact of weight gain on antepartum risk factors     Preterm labor symptoms and general obstetric precautions including but not limited to vaginal bleeding, contractions, leaking of fluid and fetal movement were reviewed in detail with the patient. Please refer to After Visit Summary for other counseling recommendations.  Return in about 1 month (around 03/29/2018).  No future appointments.  Calvert CantorSamantha C Weinhold, CNM  03/01/18  8:48 AM

## 2018-03-01 NOTE — Patient Instructions (Addendum)
Second Trimester of Pregnancy The second trimester is from week 13 through week 28, month 4 through 6. This is often the time in pregnancy that you feel your best. Often times, morning sickness has lessened or quit. You may have more energy, and you may get hungry more often. Your unborn baby (fetus) is growing rapidly. At the end of the sixth month, he or she is about 9 inches long and weighs about 1 pounds. You will likely feel the baby move (quickening) between 18 and 20 weeks of pregnancy. Follow these instructions at home:  Avoid all smoking, herbs, and alcohol. Avoid drugs not approved by your doctor.  Do not use any tobacco products, including cigarettes, chewing tobacco, and electronic cigarettes. If you need help quitting, ask your doctor. You may get counseling or other support to help you quit.  Only take medicine as told by your doctor. Some medicines are safe and some are not during pregnancy.  Exercise only as told by your doctor. Stop exercising if you start having cramps.  Eat regular, healthy meals.  Wear a good support bra if your breasts are tender.  Do not use hot tubs, steam rooms, or saunas.  Wear your seat belt when driving.  Avoid raw meat, uncooked cheese, and liter boxes and soil used by cats.  Take your prenatal vitamins.  Take 1500-2000 milligrams of calcium daily starting at the 20th week of pregnancy until you deliver your baby.  Try taking medicine that helps you poop (stool softener) as needed, and if your doctor approves. Eat more fiber by eating fresh fruit, vegetables, and whole grains. Drink enough fluids to keep your pee (urine) clear or pale yellow.  Take warm water baths (sitz baths) to soothe pain or discomfort caused by hemorrhoids. Use hemorrhoid cream if your doctor approves.  If you have puffy, bulging veins (varicose veins), wear support hose. Raise (elevate) your feet for 15 minutes, 3-4 times a day. Limit salt in your diet.  Avoid heavy  lifting, wear low heals, and sit up straight.  Rest with your legs raised if you have leg cramps or low back pain.  Visit your dentist if you have not gone during your pregnancy. Use a soft toothbrush to brush your teeth. Be gentle when you floss.  You can have sex (intercourse) unless your doctor tells you not to.  Go to your doctor visits. Get help if:  You feel dizzy.  You have mild cramps or pressure in your lower belly (abdomen).  You have a nagging pain in your belly area.  You continue to feel sick to your stomach (nauseous), throw up (vomit), or have watery poop (diarrhea).  You have bad smelling fluid coming from your vagina.  You have pain with peeing (urination). Get help right away if:  You have a fever.  You are leaking fluid from your vagina.  You have spotting or bleeding from your vagina.  You have severe belly cramping or pain.  You lose or gain weight rapidly.  You have trouble catching your breath and have chest pain.  You notice sudden or extreme puffiness (swelling) of your face, hands, ankles, feet, or legs.  You have not felt the baby move in over an hour.  You have severe headaches that do not go away with medicine.  You have vision changes. This information is not intended to replace advice given to you by your health care provider. Make sure you discuss any questions you have with your health care   provider. Document Released: 11/22/2009 Document Revised: 02/03/2016 Document Reviewed: 10/29/2012 Elsevier Interactive Patient Education  2017 [employer]  BENEFITS OF BREASTFEEDING Many women wonder if they should breastfeed. Research shows that breast milk contains the perfect balance of vitamins, protein and fat that your baby needs to grow. It also contains antibodies that help your baby's immune system to fight off viruses and bacteria and can reduce the risk of sudden infant death syndrome (SIDS). In addition, the colostrum (a fluid  secreted from the breast in the first few days after delivery) helps your newborn's digestive system to grow and function well. Breast milk is easier to digest than formula. Also, if your baby is born preterm, breast milk can help to reduce both short- and long-term health problems. BENEFITS OF BREASTFEEDING FOR MOM . Breastfeeding causes a hormone to be released that helps the uterus to contract and return to its normal size more quickly. . It aids in postpartum weight loss, reduces risk of breast and ovarian cancer, heart disease and rheumatoid arthritis. . It decreases the amount of bleeding after the baby is born. benefits of breastfeeding for baby . Provides comfort and nutrition . Protects baby against - Obesity - Diabetes - Asthma - Childhood cancers - Heart disease - Ear infections - Diarrhea - Pneumonia - Stomach problems - Serious allergies - Skin rashes . Promotes growth and development . Reduces the risk of baby having Sudden Infant Death Syndrome (SIDS) only breastmilk for the first 6 months . Protects baby against diseases/allergies . It's the perfect amount for tiny bellies . It restores baby's energy . Provides the best nutrition for baby . Giving water or formula can make baby more likely to get sick, decrease Mom's milk supply, make baby less content with breastfeeding Skin to Skin After delivery, the staff will place your baby on your chest. This helps with the following: . Regulates baby's temperature, breathing, heart rate and blood sugar . Increases Mom's milk supply . Promotes bonding . Keeps baby and Mom calm and decreases baby's crying Rooming In Your baby will stay in your room with you for the entire time you are in the hospital. This helps with the following: . Allows Mom to learn baby's feeding cues - Fluttering eyes - Sucking on tongue or hand - Rooting (opens mouth and turns head) - Nuzzling into the breast - Bringing hand to mouth . Allows  breastfeeding on demand (when your baby is ready) . Helps baby to be calm and content . Ensures a good milk supply . Prevents complications with breastfeeding . Allows parents to learn to care for baby . Allows you to request assistance with breastfeeding Importance of a good latch . Increases milk transfer to baby - baby gets enough milk . Ensures you have enough milk for your baby . Decreases nipple soreness . Don't use pacifiers and bottles - these cause baby to suck differently than breastfeeding . Promotes continuation of breastfeeding Risks of Formula Supplementation with Breastfeeding Giving your infant formula in addition to your breast-milk EXCEPT when medically necessary can lead to: . Decreases your milk supply  . Loss of confidence in yourself for providing baby's nutrition  . Engorgement and possibly mastitis  . Asthma & allergies in the baby BREASTFEEDING FAQS How long should I breastfeed my baby? It is recommended that you provide your baby with breast milk only for the first 6 months and then continue for the first year and longer as desired. During the first few weeks after   birth, your baby will need to feed 8-12 times every 24 hours, or every 2-3 hours. They will likely feed for 15-30 minutes. How can I help my baby begin breastfeeding? Babies are born with an instinct to breastfeed. A healthy baby can begin breastfeeding right away without specific help. At the hospital, a nurse (or lactation consultant) will help you begin the process and will give you tips on good positioning. It may be helpful to take a breastfeeding class before you deliver in order to know what to expect. How can I help my baby latch on? In order to assist your baby in latching-on, cup your breast in your hand and stroke your baby's lower lip with your nipple to stimulate your baby's rooting reflex. Your baby will look like he or she is yawning, at which point you should bring the baby towards your  breast, while aiming the nipple at the roof of his or her mouth. Remember to bring the baby towards you and not your breast towards the baby. How can I tell if my baby is latched-on? Your baby will have all of your nipple and part of the dark area around the nipple in his or her mouth and your baby's nose will be touching your breast. You should see or hear the baby swallowing. If the baby is not latched-on properly, start the process over. To remove the suction, insert a clean finger between your breast and the baby's mouth. Should I switch breasts during feeding? After feeding on one side, switch the baby to your other breast. If he or she does not continue feeding - that is OK. Your baby will not necessarily need to feed from both breasts in a single feeding. On the next feeding, start with the other breast for efficiency and comfort. How can I tell if my baby is hungry? When your baby is hungry, they will nuzzle against your breast, make sucking noises and tongue motions and may put their hands near their mouth. Crying is a late sign of hunger, so you should not wait until this point. When they have received enough milk, they will unlatch from the breast. Is it okay to use a pacifier? Until your baby gets the hang of breastfeeding, experts recommend limiting pacifier usage. If you have questions about this, please contact your pediatrician. What can I do to ensure proper nutrition while breastfeeding? . Make sure that you support your own health and your baby's by eating a healthy, well-balanced diet . Your provider may recommend that you continue to take your prenatal vitamin . Drink plenty of fluids. It is a good rule to drink one glass of water before or after feeding . Alcohol will remain in the breast milk for as long as it will remain in the blood stream. If you choose to have a drink, it is recommended that you wait at least 2 hours before feeding . Moderate amounts of caffeine are  OK . Some over-the-counter or prescription medications are not recommended during breastfeeding. Check with your provider if you have questions What types of birth control methods are safe while breastfeeding? Progestin-only methods, including a daily pill, an IUD, the implant and the injection are safe while breastfeeding. Methods that contain estrogen (such as combination birth control pills, the vaginal ring and the patch) should not be used during the first month of breastfeeding as these can decrease your milk supply. 

## 2018-03-03 LAB — AFP, SERUM, OPEN SPINA BIFIDA
AFP MoM: 0.83
AFP Value: 50.6 ng/mL
GEST. AGE ON COLLECTION DATE: 21.1 wk
Maternal Age At EDD: 26.2 yr
OSBR Risk 1 IN: 10000
TEST RESULTS AFP: NEGATIVE
Weight: 188 [lb_av]

## 2018-03-17 ENCOUNTER — Encounter: Payer: Self-pay | Admitting: Obstetrics and Gynecology

## 2018-03-29 ENCOUNTER — Encounter: Payer: Self-pay | Admitting: General Practice

## 2018-03-29 ENCOUNTER — Ambulatory Visit (INDEPENDENT_AMBULATORY_CARE_PROVIDER_SITE_OTHER): Payer: Medicaid Other | Admitting: Advanced Practice Midwife

## 2018-03-29 VITALS — BP 124/75 | HR 101 | Wt 189.6 lb

## 2018-03-29 DIAGNOSIS — O2602 Excessive weight gain in pregnancy, second trimester: Secondary | ICD-10-CM

## 2018-03-29 DIAGNOSIS — Z3402 Encounter for supervision of normal first pregnancy, second trimester: Secondary | ICD-10-CM

## 2018-03-29 DIAGNOSIS — R87612 Low grade squamous intraepithelial lesion on cytologic smear of cervix (LGSIL): Secondary | ICD-10-CM

## 2018-03-29 NOTE — Patient Instructions (Signed)

## 2018-03-29 NOTE — Progress Notes (Signed)
   PRENATAL VISIT NOTE  Subjective:  Zoe Keller is a 27 y.o. G1P0000 at 6470w1d being seen today for ongoing prenatal care.  She is currently monitored for the following issues for this low-risk pregnancy and has Supervision of normal first pregnancy, antepartum; Low grade squamous intraepithelial lesion (LGSIL) on cervical Pap smear; and Excessive weight gain during pregnancy in second trimester on their problem list.  Patient reports no complaints.  Contractions: Not present. Vag. Bleeding: None.  Movement: Present. Denies leaking of fluid.   The following portions of the patient's history were reviewed and updated as appropriate: allergies, current medications, past family history, past medical history, past social history, past surgical history and problem list. Problem list updated.  Objective:   Vitals:   03/29/18 1357  BP: 124/75  Pulse: (!) 101  Weight: 189 lb 9.6 oz (86 kg)    Fetal Status: Fetal Heart Rate (bpm): 148 Fundal Height: 27 cm Movement: Present     General:  Alert, oriented and cooperative. Patient is in no acute distress.  Skin: Skin is warm and dry. No rash noted.   Cardiovascular: Normal heart rate noted  Respiratory: Normal respiratory effort, no problems with respiration noted  Abdomen: Soft, gravid, appropriate for gestational age.  Pain/Pressure: Absent     Pelvic: Cervical exam deferred        Extremities: Normal range of motion.  Edema: None  Mental Status: Normal mood and affect. Normal behavior. Normal judgment and thought content.   Assessment and Plan:  Pregnancy: G1P0000 at 5270w1d  1. Encounter for supervision of normal first pregnancy in second trimester --No complaints today, continue routine care --Patient has scheduled childbirth and breastfeeding classes  2. Pap smear abnormality of cervix with LGSIL --Postpartum colposcopy  3. Excessive weight gain during pregnancy in second trimester --I lb 9.6oz weight gain from previous visit  four weeks ago --Encouraged to stick with current diet/exercise regimen  Preterm labor symptoms and general obstetric precautions including but not limited to vaginal bleeding, contractions, leaking of fluid and fetal movement were reviewed in detail with the patient. Please refer to After Visit Summary for other counseling recommendations.  Return in about 1 month (around 04/26/2018).  No future appointments.  Calvert CantorSamantha C Weinhold, CNM  03/29/18  2:11 PM

## 2018-04-15 ENCOUNTER — Telehealth: Payer: Self-pay

## 2018-04-15 NOTE — Telephone Encounter (Signed)
Pt called stating she was stung by a bee Sunday, she put tobacco on it to pull the stinger out, but it's still red and swollen, she took benadryl after it happened, she has an appt with Dr. Delorse LekPadgett Friday Aug 9, she states she doesn't have enough gas to get her here for two different appt dates. She wants to know what can she do.

## 2018-04-15 NOTE — Telephone Encounter (Signed)
Discussed with Lachelle verbally that she can ice the area, use topical steroids (ie. Hydrocortisone, triamcinolone) as well as antihistamine (ie. Benadryl, zyrtec/xyzal/allegra) to help with itch.    It can take days to weeks (upwards of 2 weeks) for local reactions to stings to fully resolve.

## 2018-04-16 NOTE — Telephone Encounter (Signed)
Error: Documented wrong pt chart. Copied and pasted to correct pt

## 2018-04-26 ENCOUNTER — Other Ambulatory Visit: Payer: Self-pay

## 2018-04-26 ENCOUNTER — Ambulatory Visit (INDEPENDENT_AMBULATORY_CARE_PROVIDER_SITE_OTHER): Payer: Medicaid Other | Admitting: Certified Nurse Midwife

## 2018-04-26 ENCOUNTER — Other Ambulatory Visit: Payer: Medicaid Other

## 2018-04-26 ENCOUNTER — Encounter: Payer: Self-pay | Admitting: Certified Nurse Midwife

## 2018-04-26 DIAGNOSIS — Z3403 Encounter for supervision of normal first pregnancy, third trimester: Secondary | ICD-10-CM

## 2018-04-26 DIAGNOSIS — Z3400 Encounter for supervision of normal first pregnancy, unspecified trimester: Secondary | ICD-10-CM

## 2018-04-26 NOTE — Progress Notes (Signed)
Pt was not fasting today.  2hr GTT and 28 wk labs will be done next week.

## 2018-04-26 NOTE — Patient Instructions (Addendum)
Safe Medications in Pregnancy   Acne: Benzoyl Peroxide Salicylic Acid  Backache/Headache: Tylenol: 2 regular strength every 4 hours OR              2 Extra strength every 6 hours  Colds/Coughs/Allergies: Benadryl (alcohol free) 25 mg every 6 hours as needed Breath right strips Claritin Cepacol throat lozenges Chloraseptic throat spray Cold-Eeze- up to three times per day Cough drops, alcohol free Flonase (by prescription only) Guaifenesin Mucinex Robitussin DM (plain only, alcohol free) Saline nasal spray/drops Sudafed (pseudoephedrine) & Actifed ** use only after [redacted] weeks gestation and if you do not have high blood pressure Tylenol Vicks Vaporub Zinc lozenges Zyrtec   Constipation: Colace Ducolax suppositories Fleet enema Glycerin suppositories Metamucil Milk of magnesia Miralax Senokot Smooth move tea  Diarrhea: Kaopectate Imodium A-D  *NO pepto Bismol  Hemorrhoids: Anusol Anusol HC Preparation H Tucks  Indigestion: Tums Maalox Mylanta Zantac  Pepcid  Insomnia: Benadryl (alcohol free) 25mg  every 6 hours as needed Tylenol PM Unisom, no Gelcaps  Leg Cramps: Tums MagGel  Nausea/Vomiting:  Bonine Dramamine Emetrol Ginger extract Sea bands Meclizine  Nausea medication to take during pregnancy:  Unisom (doxylamine succinate 25 mg tablets) Take one tablet daily at bedtime. If symptoms are not adequately controlled, the dose can be increased to a maximum recommended dose of two tablets daily (1/2 tablet in the morning, 1/2 tablet mid-afternoon and one at bedtime). Vitamin B6 100mg  tablets. Take one tablet twice a day (up to 200 mg per day).  Skin Rashes: Aveeno products Benadryl cream or 25mg  every 6 hours as needed Calamine Lotion 1% cortisone cream  Yeast infection: Gyne-lotrimin 7 Monistat 7   **If taking multiple medications, please check labels to avoid duplicating the same active ingredients **take medication as directed on  the label ** Do not exceed 4000 mg of tylenol in 24 hours **Do not take medications that contain aspirin or ibuprofen    Third Trimester of Pregnancy The third trimester is from week 29 through week 42, months 7 through 9. This trimester is when your unborn baby (fetus) is growing very fast. At the end of the ninth month, the unborn baby is about 20 inches in length. It weighs about 6-10 pounds. Follow these instructions at home:  Avoid all smoking, herbs, and alcohol. Avoid drugs not approved by your doctor.  Do not use any tobacco products, including cigarettes, chewing tobacco, and electronic cigarettes. If you need help quitting, ask your doctor. You may get counseling or other support to help you quit.  Only take medicine as told by your doctor. Some medicines are safe and some are not during pregnancy.  Exercise only as told by your doctor. Stop exercising if you start having cramps.  Eat regular, healthy meals.  Wear a good support bra if your breasts are tender.  Do not use hot tubs, steam rooms, or saunas.  Wear your seat belt when driving.  Avoid raw meat, uncooked cheese, and liter boxes and soil used by cats.  Take your prenatal vitamins.  Take 1500-2000 milligrams of calcium daily starting at the 20th week of pregnancy until you deliver your baby.  Try taking medicine that helps you poop (stool softener) as needed, and if your doctor approves. Eat more fiber by eating fresh fruit, vegetables, and whole grains. Drink enough fluids to keep your pee (urine) clear or pale yellow.  Take warm water baths (sitz baths) to soothe pain or discomfort caused by hemorrhoids. Use hemorrhoid cream if your doctor  approves.  If you have puffy, bulging veins (varicose veins), wear support hose. Raise (elevate) your feet for 15 minutes, 3-4 times a day. Limit salt in your diet.  Avoid heavy lifting, wear low heels, and sit up straight.  Rest with your legs raised if you have leg  cramps or low back pain.  Visit your dentist if you have not gone during your pregnancy. Use a soft toothbrush to brush your teeth. Be gentle when you floss.  You can have sex (intercourse) unless your doctor tells you not to.  Do not travel far distances unless you must. Only do so with your doctor's approval.  Take prenatal classes.  Practice driving to the hospital.  Pack your hospital bag.  Prepare the baby's room.  Go to your doctor visits. Get help if:  You are not sure if you are in labor or if your water has broken.  You are dizzy.  You have mild cramps or pressure in your lower belly (abdominal).  You have a nagging pain in your belly area.  You continue to feel sick to your stomach (nauseous), throw up (vomit), or have watery poop (diarrhea).  You have bad smelling fluid coming from your vagina.  You have pain with peeing (urination). Get help right away if:  You have a fever.  You are leaking fluid from your vagina.  You are spotting or bleeding from your vagina.  You have severe belly cramping or pain.  You lose or gain weight rapidly.  You have trouble catching your breath and have chest pain.  You notice sudden or extreme puffiness (swelling) of your face, hands, ankles, feet, or legs.  You have not felt the baby move in over an hour.  You have severe headaches that do not go away with medicine.  You have vision changes. This information is not intended to replace advice given to you by your health care provider. Make sure you discuss any questions you have with your health care provider. Document Released: 11/22/2009 Document Revised: 02/03/2016 Document Reviewed: 10/29/2012 Elsevier Interactive Patient Education  2017 [employer]

## 2018-04-26 NOTE — Progress Notes (Signed)
   PRENATAL VISIT NOTE  Subjective:  Zoe Keller is a 27 y.o. G1P0Carlynn Keller at 6557w1d being seen today for ongoing prenatal care.  She is currently monitored for the following issues for this low-risk pregnancy and has Supervision of normal first pregnancy, antepartum; Low grade squamous intraepithelial lesion (LGSIL) on cervical Pap smear; and Excessive weight gain during pregnancy in second trimester on their problem list.  Patient reports no complaints.  Contractions: Not present. Vag. Bleeding: None.  Movement: Present. Denies leaking of fluid.   The following portions of the patient's history were reviewed and updated as appropriate: allergies, current medications, past family history, past medical history, past social history, past surgical history and problem list. Problem list updated.  Objective:   Vitals:   04/26/18 0833  BP: 108/65  Pulse: 98  Weight: 190 lb 14.4 oz (86.6 kg)    Fetal Status: Fetal Heart Rate (bpm): 138 Fundal Height: 31 cm Movement: Present     General:  Alert, oriented and cooperative. Patient is in no acute distress.  Skin: Skin is warm and dry. No rash noted.   Cardiovascular: Normal heart rate noted  Respiratory: Normal respiratory effort, no problems with respiration noted  Abdomen: Soft, gravid, appropriate for gestational age.  Pain/Pressure: Present     Pelvic: Cervical exam deferred        Extremities: Normal range of motion.  Edema: None  Mental Status: Normal mood and affect. Normal behavior. Normal judgment and thought content.   Assessment and Plan:  Pregnancy: G1P0000 at 7357w1d  1. Supervision of normal first pregnancy, antepartum -Patient doing well, currently complaining of allergies. Patient reports taking Claritin and saline nose flushes with little relief. Safe medications during pregnancy discussed with patient and list given in AVS. -patient did not come to appointment fasting, will reschedule third trimester labs and 2hr GTT for  Tuesday. Reiterated need to come fasting with sips of water. Patient verbalizes understanding.  -Anticipatory guidance on upcoming appointments and encouraged patient to start thinking about how she wants her labor to look with certain people in room, etc.     Preterm labor symptoms and general obstetric precautions including but not limited to vaginal bleeding, contractions, leaking of fluid and fetal movement were reviewed in detail with the patient. Please refer to After Visit Summary for other counseling recommendations.  Return in about 2 weeks (around 05/10/2018) for ROB.  Future Appointments  Date Time Provider Department Center  04/30/2018  8:50 AM [facility]  05/10/2018  1:15 PM Rasch, Harolyn RutherfordJennifer I, NP [facility]    Sharyon CableVeronica C Dyan Labarbera, CNM

## 2018-04-29 ENCOUNTER — Other Ambulatory Visit: Payer: Self-pay

## 2018-04-29 DIAGNOSIS — Z3400 Encounter for supervision of normal first pregnancy, unspecified trimester: Secondary | ICD-10-CM

## 2018-04-30 ENCOUNTER — Other Ambulatory Visit: Payer: Medicaid Other

## 2018-05-01 LAB — CBC
HEMATOCRIT: 35.8 % (ref 34.0–46.6)
Hemoglobin: 12.4 g/dL (ref 11.1–15.9)
MCH: 31.3 pg (ref 26.6–33.0)
MCHC: 34.6 g/dL (ref 31.5–35.7)
MCV: 90 fL (ref 79–97)
Platelets: 395 10*3/uL (ref 150–450)
RBC: 3.96 x10E6/uL (ref 3.77–5.28)
RDW: 13.2 % (ref 12.3–15.4)
WBC: 10.1 10*3/uL (ref 3.4–10.8)

## 2018-05-01 LAB — RPR: RPR: NONREACTIVE

## 2018-05-01 LAB — HIV ANTIBODY (ROUTINE TESTING W REFLEX): HIV SCREEN 4TH GENERATION: NONREACTIVE

## 2018-05-01 LAB — GLUCOSE TOLERANCE, 2 HOURS W/ 1HR
GLUCOSE, 2 HOUR: 113 mg/dL (ref 65–152)
Glucose, 1 hour: 143 mg/dL (ref 65–179)
Glucose, Fasting: 89 mg/dL (ref 65–91)

## 2018-05-09 ENCOUNTER — Encounter (HOSPITAL_COMMUNITY): Payer: Self-pay | Admitting: *Deleted

## 2018-05-09 ENCOUNTER — Inpatient Hospital Stay (HOSPITAL_COMMUNITY)
Admission: AD | Admit: 2018-05-09 | Discharge: 2018-05-09 | Disposition: A | Discharge status: Home / Self Care | Payer: Medicaid Other | Source: Ambulatory Visit | Attending: Obstetrics and Gynecology | Admitting: Obstetrics and Gynecology

## 2018-05-09 DIAGNOSIS — Z3A31 31 weeks gestation of pregnancy: Secondary | ICD-10-CM | POA: Insufficient documentation

## 2018-05-09 DIAGNOSIS — M545 Low back pain: Secondary | ICD-10-CM | POA: Diagnosis present

## 2018-05-09 DIAGNOSIS — O9A213 Injury, poisoning and certain other consequences of external causes complicating pregnancy, third trimester: Secondary | ICD-10-CM | POA: Diagnosis not present

## 2018-05-09 DIAGNOSIS — S3992XA Unspecified injury of lower back, initial encounter: Secondary | ICD-10-CM

## 2018-05-09 DIAGNOSIS — O26893 Other specified pregnancy related conditions, third trimester: Secondary | ICD-10-CM | POA: Insufficient documentation

## 2018-05-09 DIAGNOSIS — Y92410 Unspecified street and highway as the place of occurrence of the external cause: Secondary | ICD-10-CM | POA: Diagnosis not present

## 2018-05-09 DIAGNOSIS — Z3400 Encounter for supervision of normal first pregnancy, unspecified trimester: Secondary | ICD-10-CM

## 2018-05-09 MED ORDER — ACETAMINOPHEN 325 MG PO TABS
650.0000 mg | ORAL_TABLET | Freq: Once | ORAL | Status: AC
  Administered 2018-05-09: 650 mg via ORAL
  Filled 2018-05-09: qty 2

## 2018-05-09 NOTE — Discharge Instructions (Signed)
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
## 2018-05-10 ENCOUNTER — Ambulatory Visit (INDEPENDENT_AMBULATORY_CARE_PROVIDER_SITE_OTHER): Payer: Medicaid Other | Admitting: Obstetrics and Gynecology

## 2018-05-10 DIAGNOSIS — Z23 Encounter for immunization: Secondary | ICD-10-CM | POA: Diagnosis not present

## 2018-05-10 DIAGNOSIS — Z3400 Encounter for supervision of normal first pregnancy, unspecified trimester: Secondary | ICD-10-CM

## 2018-05-10 NOTE — Progress Notes (Signed)
   PRENATAL VISIT NOTE  Subjective:  Zoe Keller is a 27 y.o. G1P0000 at 585w1d being seen today for ongoing prenatal care.  She is currently monitored for the following issues for this low-risk pregnancy and has Supervision of normal first pregnancy, antepartum; Low grade squamous intraepithelial lesion (LGSIL) on cervical Pap smear; and Excessive weight gain during pregnancy in second trimester on their problem list.  Patient reports no complaints.  Contractions: Not present. Vag. Bleeding: None.  Movement: Present. Denies leaking of fluid.   The following portions of the patient's history were reviewed and updated as appropriate: allergies, current medications, past family history, past medical history, past social history, past surgical history and problem list. Problem list updated.  Objective:   Vitals:   05/10/18 1328  BP: 126/63  Pulse: 87  Weight: 191 lb (86.6 kg)    Fetal Status: Fetal Heart Rate (bpm): 147 Fundal Height: 32 cm Movement: Present     General:  Alert, oriented and cooperative. Patient is in no acute distress.  Skin: Skin is warm and dry. No rash noted.   Cardiovascular: Normal heart rate noted  Respiratory: Normal respiratory effort, no problems with respiration noted  Abdomen: Soft, gravid, appropriate for gestational age.  Pain/Pressure: Absent     Pelvic: Cervical exam deferred        Extremities: Normal range of motion.  Edema: None  Mental Status: Normal mood and affect. Normal behavior. Normal judgment and thought content.   Assessment and Plan:  Pregnancy: G1P0000 at 755w1d  1. Supervision of normal first pregnancy, antepartum  - Discussed glucose testing.  - doing well.     Other orders - Tdap vaccine greater than or equal to 7yo IM  Preterm labor symptoms and general obstetric precautions including but not limited to vaginal bleeding, contractions, leaking of fluid and fetal movement were reviewed in detail with the patient. Please  refer to After Visit Summary for other counseling recommendations.  Return in about 2 weeks (around 05/24/2018).  No future appointments.  Venia CarbonJennifer Ketzaly Cardella, NP

## 2018-05-22 ENCOUNTER — Encounter (HOSPITAL_COMMUNITY): Payer: Self-pay

## 2018-05-22 ENCOUNTER — Other Ambulatory Visit: Payer: Self-pay

## 2018-05-22 ENCOUNTER — Inpatient Hospital Stay (HOSPITAL_COMMUNITY)
Admission: AD | Admit: 2018-05-22 | Discharge: 2018-05-22 | Disposition: A | Discharge status: Home / Self Care | Payer: Medicaid Other | Source: Ambulatory Visit | Attending: Obstetrics & Gynecology | Admitting: Obstetrics & Gynecology

## 2018-05-22 DIAGNOSIS — Z3A32 32 weeks gestation of pregnancy: Secondary | ICD-10-CM | POA: Diagnosis not present

## 2018-05-22 DIAGNOSIS — O26893 Other specified pregnancy related conditions, third trimester: Secondary | ICD-10-CM | POA: Diagnosis not present

## 2018-05-22 DIAGNOSIS — R42 Dizziness and giddiness: Secondary | ICD-10-CM | POA: Insufficient documentation

## 2018-05-22 DIAGNOSIS — O9989 Other specified diseases and conditions complicating pregnancy, childbirth and the puerperium: Secondary | ICD-10-CM

## 2018-05-22 DIAGNOSIS — Z3400 Encounter for supervision of normal first pregnancy, unspecified trimester: Secondary | ICD-10-CM

## 2018-05-22 DIAGNOSIS — Z3689 Encounter for other specified antenatal screening: Secondary | ICD-10-CM

## 2018-05-22 LAB — URINALYSIS, ROUTINE W REFLEX MICROSCOPIC
BILIRUBIN URINE: NEGATIVE
GLUCOSE, UA: NEGATIVE mg/dL
HGB URINE DIPSTICK: NEGATIVE
Ketones, ur: NEGATIVE mg/dL
Leukocytes, UA: NEGATIVE
Nitrite: NEGATIVE
PH: 7 (ref 5.0–8.0)
Protein, ur: NEGATIVE mg/dL

## 2018-05-22 LAB — GLUCOSE, CAPILLARY: Glucose-Capillary: 112 mg/dL — ABNORMAL HIGH (ref 70–99)

## 2018-05-22 NOTE — Discharge Instructions (Signed)
Eating Plan for Pregnant Women While you are pregnant, your body will require additional nutrition to help support your growing baby. It is recommended that you consume:  150 additional calories each day during your first trimester.  300 additional calories each day during your second trimester.  300 additional calories each day during your third trimester.  Eating a healthy, well-balanced diet is very important for your health and for your baby's health. You also have a higher need for some vitamins and minerals, such as folic acid, calcium, iron, and vitamin D. What do I need to know about eating during pregnancy?  Do not try to lose weight or go on a diet during pregnancy.  Choose healthy, nutritious foods. Choose  of a sandwich with a glass of milk instead of a candy bar or a high-calorie sugar-sweetened beverage.  Limit your overall intake of foods that have "empty calories." These are foods that have little nutritional value, such as sweets, desserts, candies, sugar-sweetened beverages, and fried foods.  Eat a variety of foods, especially fruits and vegetables.  Take a prenatal vitamin to help meet the additional needs during pregnancy, specifically for folic acid, iron, calcium, and vitamin D.  Remember to stay active. Ask your health care provider for exercise recommendations that are specific to you.  Practice good food safety and cleanliness, such as washing your hands before you eat and after you prepare raw meat. This helps to prevent foodborne illnesses, such as listeriosis, that can be very dangerous for your baby. Ask your health care provider for more information about listeriosis. What does 150 extra calories look like? Healthy options for an additional 150 calories each day could be any of the following:  Plain low-fat yogurt (6-8 oz) with  cup of berries.  1 apple with 2 teaspoons of peanut butter.  Cut-up vegetables with  cup of hummus.  Low-fat chocolate  milk (8 oz or 1 cup).  1 string cheese with 1 medium orange.   of a peanut butter and jelly sandwich on whole-wheat bread (1 tsp of peanut butter).  For 300 calories, you could eat two of those healthy options each day. What is a healthy amount of weight to gain? The recommended amount of weight for you to gain is based on your pre-pregnancy BMI. If your pre-pregnancy BMI was:  Less than 18 (underweight), you should gain 28-40 lb.  18-24.9 (normal), you should gain 25-35 lb.  25-29.9 (overweight), you should gain 15-25 lb.  Greater than 30 (obese), you should gain 11-20 lb.  What if I am having twins or multiples? Generally, pregnant women who will be having twins or multiples may need to increase their daily calories by 300-600 calories each day. The recommended range for total weight gain is 25-54 lb, depending on your pre-pregnancy BMI. Talk with your health care provider for specific guidance about additional nutritional needs, weight gain, and exercise during your pregnancy. What foods can I eat? Grains Any grains. Try to choose whole grains, such as whole-wheat bread, oatmeal, or brown rice. Vegetables Any vegetables. Try to eat a variety of colors and types of vegetables to get a full range of vitamins and minerals. Remember to wash your vegetables well before eating. Fruits Any fruits. Try to eat a variety of colors and types of fruit to get a full range of vitamins and minerals. Remember to wash your fruits well before eating. Meats and Other Protein Sources Lean meats, including chicken, turkey, fish, and lean cuts of beef, veal,   or pork. Make sure that all meats are cooked to "well done." Tofu. Tempeh. Beans. Eggs. Peanut butter and other nut butters. Seafood, such as shrimp, crab, and lobster. If you choose fish, select types that are higher in omega-3 fatty acids, including salmon, herring, mussels, trout, sardines, and pollock. Make sure that all meats are cooked to  food-safe temperatures. Dairy Pasteurized milk and milk alternatives. Pasteurized yogurt and pasteurized cheese. Cottage cheese. Sour cream. Beverages Water. Juices that contain 100% fruit juice or vegetable juice. Caffeine-free teas and decaffeinated coffee. Drinks that contain caffeine are okay to drink, but it is better to avoid caffeine. Keep your total caffeine intake to less than 200 mg each day (12 oz of coffee, tea, or soda) or as directed by your health care provider. Condiments Any pasteurized condiments. Sweets and Desserts Any sweets and desserts. Fats and Oils Any fats and oils. The items listed above may not be a complete list of recommended foods or beverages. Contact your dietitian for more options. What foods are not recommended? Vegetables Unpasteurized (raw) vegetable juices. Fruits Unpasteurized (raw) fruit juices. Meats and Other Protein Sources Cured meats that have nitrates, such as bacon, salami, and hotdogs. Luncheon meats, bologna, or other deli meats (unless they are reheated until they are steaming hot). Refrigerated pate, meat spreads from a meat counter, smoked seafood that is found in the refrigerated section of a store. Raw fish, such as sushi or sashimi. High mercury content fish, such as tilefish, shark, swordfish, and king mackerel. Raw meats, such as tuna or beef tartare. Undercooked meats and poultry. Make sure that all meats are cooked to food-safe temperatures. Dairy Unpasteurized (raw) milk and any foods that have raw milk in them. Soft cheeses, such as feta, queso blanco, queso fresco, Brie, Camembert cheeses, blue-veined cheeses, and Panela cheese (unless it is made with pasteurized milk, which must be stated on the label). Beverages Alcohol. Sugar-sweetened beverages, such as sodas, teas, or energy drinks. Condiments Homemade fermented foods and drinks, such as pickles, sauerkraut, or kombucha drinks. (Store-bought pasteurized versions of these are  okay.) Other Salads that are made in the store, such as ham salad, chicken salad, egg salad, tuna salad, and seafood salad. The items listed above may not be a complete list of foods and beverages to avoid. Contact your dietitian for more information. This information is not intended to replace advice given to you by your health care provider. Make sure you discuss any questions you have with your health care provider. Document Released: 06/12/2014 Document Revised: 02/03/2016 Document Reviewed: 02/10/2014 Elsevier Interactive Patient Education  2018 [employer]   
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
## 2018-05-22 NOTE — MAU Provider Note (Signed)
History     CSN: [id number]  Arrival date and time: 05/22/18 1438    Chief Complaint  Patient presents with  . Dizziness   G1 @32 .4 wks here with lightheadedness, dizziness, and vomiting. Reports feeling lightheaded this am and proceeded to eat cereal, juice, and milk. The sx didn't improve so she ate some eggs. Shortly later she had a BM then felt dizzy then vomited. Reports feeling better after this. Sx have almost completetly resolved since arrival. Reports good FM. No VB, LOF, or ctx.    OB History    Gravida  1   Para  0   Term  0   Preterm  0   AB  0   Living  0     SAB  0   TAB  0   Ectopic  0   Multiple  0   Live Births  0           Past Medical History:  Diagnosis Date  . Medical history non-contributory     Past Surgical History:  Procedure Laterality Date  . HERNIA REPAIR      Family History  Problem Relation Age of Onset  . Diabetes Maternal Grandmother     Social History   Tobacco Use  . Smoking status: Never Smoker  . Smokeless tobacco: Never Used  Substance Use Topics  . Alcohol use: Never    Frequency: Never  . Drug use: Never    Allergies: No Known Allergies  Medications Prior to Admission  Medication Sig Dispense Refill Last Dose  . Prenatal Vit-Fe Fumarate-FA (MULTIVITAMIN-PRENATAL) 27-0.8 MG TABS tablet Take 1 tablet by mouth daily at 12 noon.   05/22/2018 at Unknown time    Review of Systems  Gastrointestinal: Positive for vomiting. Negative for abdominal pain.  Genitourinary: Negative for vaginal bleeding.  Neurological: Positive for dizziness and light-headedness. Negative for syncope.   Physical Exam   Blood pressure 119/68, pulse 81, temperature 98.2 F (36.8 C), temperature source Oral, resp. rate 16, weight 87.3 kg, last menstrual period 10/04/2017, SpO2 99 %.  Physical Exam  Constitutional: She is oriented to person, place, and time. She appears well-developed and well-nourished. No distress.  HENT:   Head: Normocephalic and atraumatic.  Right Ear: Hearing, external ear and ear canal normal.  Left Ear: Hearing, external ear and ear canal normal.  Cerumen impacting therefore cannot visualize TM  Cardiovascular: Normal rate.  Respiratory: Effort normal. No respiratory distress.  Musculoskeletal: Normal range of motion.  Neurological: She is alert and oriented to person, place, and time.  Skin: Skin is warm and dry.  Psychiatric: She has a normal mood and affect.  EFM: 135 bpm, mod variability, + accels, no decels Toco: none  Results for orders placed or performed during the hospital encounter of 05/22/18 (from the past 24 hour(s))  Urinalysis, Routine w reflex microscopic     Status: Abnormal   Collection Time: 05/22/18  3:00 PM  Result Value Ref Range   Color, Urine YELLOW YELLOW   APPearance CLEAR CLEAR   Specific Gravity, Urine <1.005 (L) 1.005 - 1.030   pH 7.0 5.0 - 8.0   Glucose, UA NEGATIVE NEGATIVE mg/dL   Hgb urine dipstick NEGATIVE NEGATIVE   Bilirubin Urine NEGATIVE NEGATIVE   Ketones, ur NEGATIVE NEGATIVE mg/dL   Protein, ur NEGATIVE NEGATIVE mg/dL   Nitrite NEGATIVE NEGATIVE   Leukocytes, UA NEGATIVE NEGATIVE  Glucose, capillary     Status: Abnormal   Collection Time: 05/22/18  4:06  PM  Result Value Ref Range   Glucose-Capillary 112 (H) 70 - 99 mg/dL   MAU Course  Procedures  MDM Labs ordered and reviewed. Sx likely caused by blood sugar fluctuation d/t high carb meal. No GDM and recent Hgb normal therefore not repeated today. Discussed pairing protein with card for each meal and snack, eat often. Stable for discharge home.  Assessment and Plan   1. [redacted] weeks gestation of pregnancy   2. Supervision of normal first pregnancy, antepartum   3. NST (non-stress test) reactive   4. Dizziness    Discharge home Follow up in OB office tomorrow Return precautions  Allergies as of 05/22/2018   No Known Allergies     Medication List    TAKE these medications    multivitamin-prenatal 27-0.8 MG Tabs tablet Take 1 tablet by mouth daily at 12 noon.      Donette Larry, CNM 05/22/2018, 3:50 PM

## 2018-05-22 NOTE — MAU Note (Signed)
Woke up around 10.  Ate some cereal.  Felt kind of light headed.  Her mom cooked her some eggs. Then she threw up.  Wanted to make sure everything was ok. Still feels a little lightheaded. No longer nauseated.

## 2018-05-23 ENCOUNTER — Ambulatory Visit (INDEPENDENT_AMBULATORY_CARE_PROVIDER_SITE_OTHER): Payer: Medicaid Other | Admitting: Advanced Practice Midwife

## 2018-05-23 VITALS — BP 109/78 | HR 92 | Wt 193.9 lb

## 2018-05-23 DIAGNOSIS — O2602 Excessive weight gain in pregnancy, second trimester: Secondary | ICD-10-CM

## 2018-05-23 DIAGNOSIS — Z3403 Encounter for supervision of normal first pregnancy, third trimester: Secondary | ICD-10-CM

## 2018-05-23 NOTE — Patient Instructions (Signed)
Research childbirth classes and hospital preregistration at [email or web address]  Fetal Movement Counts Patient Name: ________________________________________________ Patient Due Date: ____________________ What is a fetal movement count? A fetal movement count is the number of times that you feel your baby move during a certain amount of time. This may also be called a fetal kick count. A fetal movement count is recommended for every pregnant woman. You may be asked to start counting fetal movements as early as week 28 of your pregnancy. Pay attention to when your baby is most active. You may notice your baby's sleep and wake cycles. You may also notice things that make your baby move more. You should do a fetal movement count:  When your baby is normally most active.  At the same time each day.  A good time to count movements is while you are resting, after having something to eat and drink. How do I count fetal movements? 1. Find a quiet, comfortable area. Sit, or lie down on your side. 2. Write down the date, the start time and stop time, and the number of movements that you felt between those two times. Take this information with you to your health care visits. 3. For 2 hours, count kicks, flutters, swishes, rolls, and jabs. You should feel at least 10 movements during 2 hours. 4. You may stop counting after you have felt 10 movements. 5. If you do not feel 10 movements in 2 hours, have something to eat and drink. Then, keep resting and counting for 1 hour. If you feel at least 4 movements during that hour, you may stop counting. Contact a health care provider if:  You feel fewer than 4 movements in 2 hours.  Your baby is not moving like he or she usually does. Date: ____________ Start time: ____________ Stop time: ____________ Movements: ____________ Date: ____________ Start time: ____________ Stop time: ____________ Movements: ____________ Date: ____________ Start time: ____________  Stop time: ____________ Movements: ____________ Date: ____________ Start time: ____________ Stop time: ____________ Movements: ____________ Date: ____________ Start time: ____________ Stop time: ____________ Movements: ____________ Date: ____________ Start time: ____________ Stop time: ____________ Movements: ____________ Date: ____________ Start time: ____________ Stop time: ____________ Movements: ____________ Date: ____________ Start time: ____________ Stop time: ____________ Movements: ____________ Date: ____________ Start time: ____________ Stop time: ____________ Movements: ____________ This information is not intended to replace advice given to you by your health care provider. Make sure you discuss any questions you have with your health care provider. Document Released: 09/27/2006 Document Revised: 04/26/2016 Document Reviewed: 10/07/2015 Elsevier Interactive Patient Education  2018 [employer]  Braxton Hicks Contractions Contractions of the uterus can occur throughout pregnancy, but they are not always a sign that you are in labor. You may have practice contractions called Braxton Hicks contractions. These false labor contractions are sometimes confused with true labor. What are Braxton Hicks contractions? Braxton Hicks contractions are tightening movements that occur in the muscles of the uterus before labor. Unlike true labor contractions, these contractions do not result in opening (dilation) and thinning of the cervix. Toward the end of pregnancy (32-34 weeks), Braxton Hicks contractions can happen more often and may become stronger. These contractions are sometimes difficult to tell apart from true labor because they can be very uncomfortable. You should not feel embarrassed if you go to the hospital with false labor. Sometimes, the only way to tell if you are in true labor is for your health care provider to look for changes in the cervix. The health care provider will   do a physical  exam and may monitor your contractions. If you are not in true labor, the exam should show that your cervix is not dilating and your water has not broken. If there are other health problems associated with your pregnancy, it is completely safe for you to be sent home with false labor. You may continue to have Braxton Hicks contractions until you go into true labor. How to tell the difference between true labor and false labor True labor  Contractions last 30-70 seconds.  Contractions become very regular.  Discomfort is usually felt in the top of the uterus, and it spreads to the lower abdomen and low back.  Contractions do not go away with walking.  Contractions usually become more intense and increase in frequency.  The cervix dilates and gets thinner. False labor  Contractions are usually shorter and not as strong as true labor contractions.  Contractions are usually irregular.  Contractions are often felt in the front of the lower abdomen and in the groin.  Contractions may go away when you walk around or change positions while lying down.  Contractions get weaker and are shorter-lasting as time goes on.  The cervix usually does not dilate or become thin. Follow these instructions at home:  Take over-the-counter and prescription medicines only as told by your health care provider.  Keep up with your usual exercises and follow other instructions from your health care provider.  Eat and drink lightly if you think you are going into labor.  If Braxton Hicks contractions are making you uncomfortable: ? Change your position from lying down or resting to walking, or change from walking to resting. ? Sit and rest in a tub of warm water. ? Drink enough fluid to keep your urine pale yellow. Dehydration may cause these contractions. ? Do slow and deep breathing several times an hour.  Keep all follow-up prenatal visits as told by your health care provider. This is  important. Contact a health care provider if:  You have a fever.  You have continuous pain in your abdomen. Get help right away if:  Your contractions become stronger, more regular, and closer together.  You have fluid leaking or gushing from your vagina.  You pass blood-tinged mucus (bloody show).  You have bleeding from your vagina.  You have low back pain that you never had before.  You feel your baby's head pushing down and causing pelvic pressure.  Your baby is not moving inside you as much as it used to. Summary  Contractions that occur before labor are called Braxton Hicks contractions, false labor, or practice contractions.  Braxton Hicks contractions are usually shorter, weaker, farther apart, and less regular than true labor contractions. True labor contractions usually become progressively stronger and regular and they become more frequent.  Manage discomfort from Braxton Hicks contractions by changing position, resting in a warm bath, drinking plenty of water, or practicing deep breathing. This information is not intended to replace advice given to you by your health care provider. Make sure you discuss any questions you have with your health care provider. Document Released: 01/11/2017 Document Revised: 01/11/2017 Document Reviewed: 01/11/2017 Elsevier Interactive Patient Education  2018 [employer]    

## 2018-05-23 NOTE — Progress Notes (Signed)
   PRENATAL VISIT NOTE  Subjective:  Zoe Keller is a 27 y.o. G1P0000 at 6587w0d being seen today for ongoing prenatal care.  She is currently monitored for the following issues for this low-risk pregnancy and has Supervision of normal first pregnancy, antepartum; Low grade squamous intraepithelial lesion (LGSIL) on cervical Pap smear; and Excessive weight gain during pregnancy in second trimester on their problem list.  Patient reports no complaints.  Contractions: Irritability. Vag. Bleeding: None.  Movement: Present. Denies leaking of fluid.   The following portions of the patient's history were reviewed and updated as appropriate: allergies, current medications, past family history, past medical history, past social history, past surgical history and problem list. Problem list updated.  Objective:   Vitals:   05/23/18 1437  BP: 109/78  Pulse: 92  Weight: 193 lb 14.4 oz (88 kg)    Fetal Status: Fetal Heart Rate (bpm): 143 Fundal Height: 34 cm Movement: Present     General:  Alert, oriented and cooperative. Patient is in no acute distress.  Skin: Skin is warm and dry. No rash noted.   Cardiovascular: Normal heart rate noted  Respiratory: Normal respiratory effort, no problems with respiration noted  Abdomen: Soft, gravid, appropriate for gestational age.  Pain/Pressure: Absent     Pelvic: Cervical exam deferred        Extremities: Normal range of motion.  Edema: None  Mental Status: Normal mood and affect. Normal behavior. Normal judgment and thought content.   Assessment and Plan:  Pregnancy: G1P0000 at 2487w0d  1. Encounter for supervision of normal first pregnancy in third trimester --No complaints or concerns, continue routine care --Discussed milestones for future visits, GBS and GC/C during 36th week --Dizziness r/t sinus congestion now resolved  2. Excessive weight gain during pregnancy  --3 lb gain in past month, good job improving nutrition from early  pregnancy  Preterm labor symptoms and general obstetric precautions including but not limited to vaginal bleeding, contractions, leaking of fluid and fetal movement were reviewed in detail with the patient. Please refer to After Visit Summary for other counseling recommendations.  Return in about 2 weeks (around 06/06/2018).  No future appointments.  Calvert CantorSamantha C Harshil Cavallaro, CNM  05/23/18  2:51 PM

## 2018-06-07 ENCOUNTER — Ambulatory Visit (INDEPENDENT_AMBULATORY_CARE_PROVIDER_SITE_OTHER): Payer: Medicaid Other

## 2018-06-07 VITALS — BP 130/71 | HR 97 | Wt 192.4 lb

## 2018-06-07 DIAGNOSIS — Z3400 Encounter for supervision of normal first pregnancy, unspecified trimester: Secondary | ICD-10-CM

## 2018-06-07 DIAGNOSIS — Z3403 Encounter for supervision of normal first pregnancy, third trimester: Secondary | ICD-10-CM

## 2018-06-07 NOTE — Patient Instructions (Signed)
Group B Streptococcus Infection During Pregnancy Group B Streptococcus (GBS) is a type of bacteria (Streptococcus agalactiae) that is often found in healthy people, commonly in the rectum, vagina, and intestines. In people who are healthy and not pregnant, the bacteria rarely cause serious illness or complications. However, women who test positive for GBS during pregnancy can pass the bacteria to their baby during childbirth, which can cause serious infection in the baby after birth. Women with GBS may also have infections during their pregnancy or immediately after childbirth, such as such as urinary tract infections (UTIs) or infections of the uterus (uterine infections). Having GBS also increases a woman's risk of complications during pregnancy, such as early (preterm) labor or delivery, miscarriage, or stillbirth. Routine testing (screening) for GBS is recommended for all pregnant women. What increases the risk? You may have a higher risk for GBS infection during pregnancy if you had one during a past pregnancy. What are the signs or symptoms? In most cases, GBS infection does not cause symptoms in pregnant women. Signs and symptoms of a possible GBS-related infection may include:  Labor starting before the 37th week of pregnancy.  A UTI or bladder infection, which may cause: ? Fever. ? Pain or burning during urination. ? Frequent urination.  Fever during labor, along with: ? Bad-smelling discharge. ? Uterine tenderness. ? Rapid heartbeat in the mother, baby, or both.  Rare but serious symptoms of a possible GBS-related infection in women include:  Blood infection (septicemia). This may cause fever, chills, or confusion.  Lung infection (pneumonia). This may cause fever, chills, cough, rapid breathing, difficulty breathing, or chest pain.  Bone, joint, skin, or soft tissue infection.  How is this diagnosed? You may be screened for GBS between week 35 and week 37 of your pregnancy. If  you have symptoms of preterm labor, you may be screened earlier. This condition is diagnosed based on lab test results from:  A swab of fluid from the vagina and rectum.  A urine sample.  How is this treated? This condition is treated with antibiotic medicine. When you go into labor, or as soon as your water breaks (your membranes rupture), you will be given antibiotics through an IV tube. Antibiotics will continue until after you give birth. If you are having a cesarean delivery, you do not need antibiotics unless your membranes have already ruptured. Follow these instructions at home:  Take over-the-counter and prescription medicines only as told by your health care provider.  Take your antibiotic medicine as told by your health care provider. Do not stop taking the antibiotic even if you start to feel better.  Keep all pre-birth (prenatal) visits and follow-up visits as told by your health care provider. This is important. Contact a health care provider if:  You have pain or burning when you urinate.  You have to urinate frequently.  You have a fever or chills.  You develop a bad-smelling vaginal discharge. Get help right away if:  Your membranes rupture.  You go into labor.  You have severe pain in your abdomen.  You have difficulty breathing.  You have chest pain. This information is not intended to replace advice given to you by your health care provider. Make sure you discuss any questions you have with your health care provider. Document Released: 12/05/2007 Document Revised: 03/24/2016 Document Reviewed: 03/23/2016 Elsevier Interactive Patient Education  2018 [employer]  

## 2018-06-07 NOTE — Progress Notes (Signed)
   PRENATAL VISIT NOTE  Subjective:  Zoe Keller is a 27 y.o. G1P0000 at [redacted]w[redacted]d being seen today for ongoing prenatal care.  She is currently monitored for the following issues for this low-risk pregnancy and has Supervision of normal first pregnancy, antepartum; Low grade squamous intraepithelial lesion (LGSIL) on cervical Pap smear; and Excessive weight gain during pregnancy in second trimester on their problem list.  Patient reports no complaints.  Contractions: Irregular. Vag. Bleeding: None.  Movement: Present. Denies leaking of fluid.   The following portions of the patient's history were reviewed and updated as appropriate: allergies, current medications, past family history, past medical history, past social history, past surgical history and problem list. Problem list updated.  Objective:   Vitals:   06/07/18 1055  BP: 130/71  Pulse: 97  Weight: 192 lb 6.4 oz (87.3 kg)    Fetal Status: Fetal Heart Rate (bpm): 155   Movement: Present     General:  Alert, oriented and cooperative. Patient is in no acute distress.  Skin: Skin is warm and dry. No rash noted.   Cardiovascular: Normal heart rate noted  Respiratory: Normal respiratory effort, no problems with respiration noted  Abdomen: Soft, gravid, appropriate for gestational age.  Pain/Pressure: Absent     Pelvic: Cervical exam deferred        Extremities: Normal range of motion.  Edema: None  Mental Status: Normal mood and affect. Normal behavior. Normal judgment and thought content.   Assessment and Plan:  Pregnancy: G1P0000 at 109w1d  1. Encounter for supervision of normal first pregnancy in third trimester - No complaints. Routine care - GBS next visit  Preterm labor symptoms and general obstetric precautions including but not limited to vaginal bleeding, contractions, leaking of fluid and fetal movement were reviewed in detail with the patient. Please refer to After Visit Summary for other counseling  recommendations.  Return in about 1 week (around 06/14/2018) for Return OB visit with GBS .  Future Appointments  Date Time Provider Department Center  06/11/2018  8:00 AM Marry Guan, PT [facility]    Rolm Bookbinder, [place] 06/07/18 10:59 AM

## 2018-06-10 ENCOUNTER — Encounter (HOSPITAL_COMMUNITY): Payer: Self-pay | Admitting: *Deleted

## 2018-06-10 ENCOUNTER — Inpatient Hospital Stay (HOSPITAL_COMMUNITY)
Admission: AD | Admit: 2018-06-10 | Discharge: 2018-06-10 | Disposition: A | Discharge status: Home / Self Care | Payer: Medicaid Other | Source: Ambulatory Visit | Attending: Family Medicine | Admitting: Family Medicine

## 2018-06-10 DIAGNOSIS — Z3A35 35 weeks gestation of pregnancy: Secondary | ICD-10-CM

## 2018-06-10 DIAGNOSIS — O219 Vomiting of pregnancy, unspecified: Secondary | ICD-10-CM

## 2018-06-10 DIAGNOSIS — O4703 False labor before 37 completed weeks of gestation, third trimester: Secondary | ICD-10-CM

## 2018-06-10 DIAGNOSIS — R42 Dizziness and giddiness: Secondary | ICD-10-CM | POA: Insufficient documentation

## 2018-06-10 DIAGNOSIS — O212 Late vomiting of pregnancy: Secondary | ICD-10-CM

## 2018-06-10 DIAGNOSIS — Z3689 Encounter for other specified antenatal screening: Secondary | ICD-10-CM

## 2018-06-10 DIAGNOSIS — O479 False labor, unspecified: Secondary | ICD-10-CM

## 2018-06-10 LAB — URINALYSIS, ROUTINE W REFLEX MICROSCOPIC
Bilirubin Urine: NEGATIVE
Glucose, UA: NEGATIVE mg/dL
Hgb urine dipstick: NEGATIVE
Ketones, ur: NEGATIVE mg/dL
Leukocytes, UA: NEGATIVE
Nitrite: NEGATIVE
Protein, ur: NEGATIVE mg/dL
Specific Gravity, Urine: 1.018 (ref 1.005–1.030)
pH: 6 (ref 5.0–8.0)

## 2018-06-10 LAB — CBC
HCT: 36.8 % (ref 36.0–46.0)
Hemoglobin: 12.7 g/dL (ref 12.0–15.0)
MCH: 30.3 pg (ref 26.0–34.0)
MCHC: 34.5 g/dL (ref 30.0–36.0)
MCV: 87.8 fL (ref 78.0–100.0)
Platelets: 397 10*3/uL (ref 150–400)
RBC: 4.19 MIL/uL (ref 3.87–5.11)
RDW: 12.7 % (ref 11.5–15.5)
WBC: 13.2 10*3/uL — ABNORMAL HIGH (ref 4.0–10.5)

## 2018-06-10 MED ORDER — ONDANSETRON HCL 4 MG/2ML IJ SOLN
4.0000 mg | Freq: Once | INTRAMUSCULAR | Status: AC
  Administered 2018-06-10: 4 mg via INTRAVENOUS
  Filled 2018-06-10: qty 2

## 2018-06-10 MED ORDER — ONDANSETRON 4 MG PO TBDP: 4.0000 mg | ORAL_TABLET | Freq: Three times a day (TID) | ORAL | 0 refills | Status: DC | PRN

## 2018-06-10 MED ORDER — LACTATED RINGERS IV BOLUS
1000.0000 mL | Freq: Once | INTRAVENOUS | Status: AC
  Administered 2018-06-10: 1000 mL via INTRAVENOUS

## 2018-06-10 MED ORDER — NIFEDIPINE 10 MG PO CAPS
10.0000 mg | ORAL_CAPSULE | ORAL | Status: DC | PRN
  Administered 2018-06-10 (×3): 10 mg via ORAL
  Filled 2018-06-10 (×3): qty 1

## 2018-06-10 MED ORDER — ONDANSETRON 8 MG PO TBDP: 8.0000 mg | ORAL_TABLET | Freq: Once | ORAL | Status: DC

## 2018-06-10 NOTE — MAU Note (Signed)
Urine sent to lab 

## 2018-06-10 NOTE — MAU Provider Note (Addendum)
History     CSN: [id number]  Arrival date and time: 06/10/18 1318   First Provider Initiated Contact with Patient 06/10/18 1355      Chief Complaint  Patient presents with  . Emesis   Zoe Keller is a 27 y.o. G1P0 at [redacted]w[redacted]d who presents to MAU with complaints of emesis and lightheadedness. She reports waking up this morning feeling lightheaded, ate breakfast and vomited directly after eating. She reports since breakfast continued emesis and unable to keep food or liquid down. She reports vomiting 3 times since breakfast. She denies abdominal pain, vaginal bleeding, vaginal discharge, or LOF. She denies dizziness, vision changes, syncope or HA. She reports +FM.    OB History    Gravida  1   Para  0   Term  0   Preterm  0   AB  0   Living  0     SAB  0   TAB  0   Ectopic  0   Multiple  0   Live Births  0           Past Medical History:  Diagnosis Date  . Medical history non-contributory     Past Surgical History:  Procedure Laterality Date  . HERNIA REPAIR      Family History  Problem Relation Age of Onset  . Diabetes Maternal Grandmother     Social History   Tobacco Use  . Smoking status: Never Smoker  . Smokeless tobacco: Never Used  Substance Use Topics  . Alcohol use: Never    Frequency: Never  . Drug use: Never    Allergies: No Known Allergies  Medications Prior to Admission  Medication Sig Dispense Refill Last Dose  . Prenatal Vit-Fe Fumarate-FA (MULTIVITAMIN-PRENATAL) 27-0.8 MG TABS tablet Take 1 tablet by mouth daily at 12 noon.   06/09/2018 at Unknown time    Review of Systems  Constitutional: Negative.   Respiratory: Negative.   Cardiovascular: Negative.   Gastrointestinal: Positive for vomiting. Negative for abdominal pain, constipation and diarrhea.  Genitourinary: Negative.   Musculoskeletal: Negative.   Neurological: Positive for light-headedness. Negative for dizziness, syncope and headaches.   Physical Exam    Blood pressure 115/77, pulse 87, temperature 97.7 F (36.5 C), temperature source Oral, resp. rate 18, height 5\' 3"  (1.6 m), weight 87.1 kg, last menstrual period 10/04/2017.  Physical Exam  Nursing note and vitals reviewed. Constitutional: She is oriented to person, place, and time. She appears well-developed and well-nourished. No distress.  Cardiovascular: Normal rate, regular rhythm and normal heart sounds.  Respiratory: Breath sounds normal. No respiratory distress. She has no wheezes.  GI: Soft.  Gravid appropriate for gestational age  Musculoskeletal: Normal range of motion. She exhibits no edema.  Neurological: She is alert and oriented to person, place, and time.  Skin: Skin is warm and dry.  Psychiatric: She has a normal mood and affect. Her behavior is normal. Thought content normal.   Dilation: Closed Effacement (%): Thick Cervical Position: Posterior Exam by:: V Madelena Maturin CNM  FHR: 125/ moderate/ +accels/ no decelerations  Toco: 2-3 minutes/ mild by palpation   MAU Course  Procedures  MDM Orders Placed This Encounter  Procedures  . Urinalysis, Routine w reflex microscopic  . CBC  . Insert peripheral IV   Results for orders placed or performed during the hospital encounter of 06/10/18 (from the past 24 hour(s))  CBC     Status: Abnormal   Collection Time: 06/10/18  2:57 PM  Result Value  Ref Range   WBC 13.2 (H) 4.0 - 10.5 K/uL   RBC 4.19 3.87 - 5.11 MIL/uL   Hemoglobin 12.7 12.0 - 15.0 g/dL   HCT 40.9 81.1 - 91.4 %   MCV 87.8 78.0 - 100.0 fL   MCH 30.3 26.0 - 34.0 pg   MCHC 34.5 30.0 - 36.0 g/dL   RDW 78.2 95.6 - 21.3 %   Platelets 397 150 - 400 K/uL   Labs reviewed:  CBC- WNL  Urinalysis- negative  NST reactive   Treatments in MAU included Zofran 4mg  IV and LR IV bolus due to regular contractions. Patient reports not feeling contractions. Cervix closed. Procardia x2 doses given for continued contractions after hydration. Contractions have spaced to  every 5 minute after dosing, patient reports feeling better and not lightheaded after Hydration.    Follow up as scheduled for prenatal appointments and return to MAU as needed for reasons discussed. Pt stable at time of discharge.   Assessment and Plan   1. Nausea and vomiting during pregnancy   2. [redacted] weeks gestation of pregnancy   3. Lightheaded   4. NST (non-stress test) reactive   5. Braxton Hicks contractions    Discharge home  Follow up as scheduled for prenatal appointments  Return to MAU as needed  Education on increasing protein in diet with each meal Continue prenatal vitamins  Rx for Zofran sent to pharmacy of choice for home use  Hydration   Follow-up Information    [facility] for [facility] Follow up.   Specialty:  Obstetrics and Gynecology Why:  Follow up as scheduled for prenatal appointment on Thursday  Contact information: [address] [place] [address] [phone]          Allergies as of 06/10/2018   No Known Allergies     Medication List    TAKE these medications   multivitamin-prenatal 27-0.8 MG Tabs tablet Take 1 tablet by mouth daily at 12 noon.   ondansetron 4 MG disintegrating tablet Commonly known as:  ZOFRAN-ODT Take 1 tablet (4 mg total) by mouth every 8 (eight) hours as needed for nausea or vomiting.       Sharyon Cable CNM 06/11/2018, 2:58 PM

## 2018-06-10 NOTE — MAU Note (Signed)
Pt felt lightheaded this morning, ate breakfast, then vomited.  Has vomited 3 times day, denies diarrhea.  No pain.  Denies bleeding or LOF.  Reports good fetal movement.

## 2018-06-11 ENCOUNTER — Ambulatory Visit: Payer: Medicaid Other | Attending: Family Medicine | Admitting: Physical Therapy

## 2018-06-13 ENCOUNTER — Ambulatory Visit (INDEPENDENT_AMBULATORY_CARE_PROVIDER_SITE_OTHER): Payer: Medicaid Other | Admitting: Obstetrics and Gynecology

## 2018-06-13 ENCOUNTER — Other Ambulatory Visit (HOSPITAL_COMMUNITY)
Admission: RE | Admit: 2018-06-13 | Discharge: 2018-06-13 | Disposition: A | Discharge status: Home / Self Care | Payer: Medicaid Other | Source: Ambulatory Visit | Attending: Obstetrics and Gynecology | Admitting: Obstetrics and Gynecology

## 2018-06-13 DIAGNOSIS — Z3400 Encounter for supervision of normal first pregnancy, unspecified trimester: Secondary | ICD-10-CM

## 2018-06-13 DIAGNOSIS — Z3403 Encounter for supervision of normal first pregnancy, third trimester: Secondary | ICD-10-CM | POA: Diagnosis present

## 2018-06-13 LAB — OB RESULTS CONSOLE GBS: GBS: NEGATIVE

## 2018-06-13 LAB — OB RESULTS CONSOLE GC/CHLAMYDIA: GC PROBE AMP, GENITAL: NEGATIVE

## 2018-06-13 NOTE — Progress Notes (Signed)
   PRENATAL VISIT NOTE  Subjective:  Zoe Keller is a 27 y.o. G1P0000 at [redacted]w[redacted]d being seen today for ongoing prenatal care.  She is currently monitored for the following issues for this low-risk pregnancy and has Supervision of normal first pregnancy, antepartum; Low grade squamous intraepithelial lesion (LGSIL) on cervical Pap smear; and Excessive weight gain during pregnancy in second trimester on their problem list.  Patient reports no complaints.  Contractions: Not present. Vag. Bleeding: None.  Movement: Present. Denies leaking of fluid.   The following portions of the patient's history were reviewed and updated as appropriate: allergies, current medications, past family history, past medical history, past social history, past surgical history and problem list. Problem list updated.  Objective:   Vitals:   06/13/18 1425  BP: 124/69  Pulse: 85  Weight: 196 lb 6.4 oz (89.1 kg)    Fetal Status: Fetal Heart Rate (bpm): 141 Fundal Height: 36 cm Movement: Present     General:  Alert, oriented and cooperative. Patient is in no acute distress.  Skin: Skin is warm and dry. No rash noted.   Cardiovascular: Normal heart rate noted  Respiratory: Normal respiratory effort, no problems with respiration noted  Abdomen: Soft, gravid, appropriate for gestational age.  Pain/Pressure: Absent     Pelvic: Cervical exam deferred        Extremities: Normal range of motion.  Edema: None  Mental Status: Normal mood and affect. Normal behavior. Normal judgment and thought content.   Assessment and Plan:  Pregnancy: G1P0000 at [redacted]w[redacted]d  1. Supervision of normal first pregnancy, antepartum  - GC/Chlamydia probe amp ([place])not at [facility] - Culture, beta strep (group b only) - Doing well.   Preterm labor symptoms and general obstetric precautions including but not limited to vaginal bleeding, contractions, leaking of fluid and fetal movement were reviewed in detail with the patient. Please refer  to After Visit Summary for other counseling recommendations.  No follow-ups on file.  Future Appointments  Date Time Provider Department Center  06/21/2018 12:35 PM Judeth Horn, NP [facility]  06/28/2018 11:35 AM Judeth Horn, NP [facility]  07/05/2018 11:55 AM Judeth Horn, NP [facility]    Venia Carbon, NP

## 2018-06-13 NOTE — Progress Notes (Signed)
Pt states last night couldn't breathe good , had to prop herself up.

## 2018-06-14 LAB — GC/CHLAMYDIA PROBE AMP (~~LOC~~) NOT AT ARMC
Chlamydia: NEGATIVE
Neisseria Gonorrhea: NEGATIVE

## 2018-06-17 LAB — CULTURE, BETA STREP (GROUP B ONLY): STREP GP B CULTURE: NEGATIVE

## 2018-06-21 ENCOUNTER — Ambulatory Visit (INDEPENDENT_AMBULATORY_CARE_PROVIDER_SITE_OTHER): Payer: Medicaid Other | Admitting: Student

## 2018-06-21 VITALS — BP 104/67 | HR 75 | Wt 196.9 lb

## 2018-06-21 DIAGNOSIS — Z3400 Encounter for supervision of normal first pregnancy, unspecified trimester: Secondary | ICD-10-CM

## 2018-06-21 DIAGNOSIS — Z3403 Encounter for supervision of normal first pregnancy, third trimester: Secondary | ICD-10-CM

## 2018-06-21 NOTE — Progress Notes (Signed)
   PRENATAL VISIT NOTE  Subjective:  Zoe Keller is a 27 y.o. G1P0000 at [redacted]w[redacted]d being seen today for ongoing prenatal care.  She is currently monitored for the following issues for this low-risk pregnancy and has Supervision of normal first pregnancy, antepartum; Low grade squamous intraepithelial lesion (LGSIL) on cervical Pap smear; and Excessive weight gain during pregnancy in second trimester on their problem list.  Patient reports no complaints.  Contractions: Not present. Vag. Bleeding: None.  Movement: Present. Denies leaking of fluid.   The following portions of the patient's history were reviewed and updated as appropriate: allergies, current medications, past family history, past medical history, past social history, past surgical history and problem list. Problem list updated.  Objective:   Vitals:   06/21/18 1242  BP: 104/67  Pulse: 75  Weight: 196 lb 14.4 oz (89.3 kg)    Fetal Status: Fetal Heart Rate (bpm): 145 Fundal Height: 37 cm Movement: Present  Presentation: Vertex  General:  Alert, oriented and cooperative. Patient is in no acute distress.  Skin: Skin is warm and dry. No rash noted.   Cardiovascular: Normal heart rate noted  Respiratory: Normal respiratory effort, no problems with respiration noted  Abdomen: Soft, gravid, appropriate for gestational age.  Pain/Pressure: Present     Pelvic: Cervical exam deferred        Extremities: Normal range of motion.  Edema: None  Mental Status: Normal mood and affect. Normal behavior. Normal judgment and thought content.   Assessment and Plan:  Pregnancy: G1P0000 at [redacted]w[redacted]d  1. Supervision of normal first pregnancy, antepartum -doing well -undecided about contraception, given list of options  Term labor symptoms and general obstetric precautions including but not limited to vaginal bleeding, contractions, leaking of fluid and fetal movement were reviewed in detail with the patient. Please refer to After Visit  Summary for other counseling recommendations.  Return in about 1 week (around 06/28/2018) for Routine OB.  Future Appointments  Date Time Provider Department Center  06/28/2018 11:35 AM Judeth Horn, NP [facility]  07/05/2018 11:55 AM Judeth Horn, NP [facility]    Judeth Horn, NP

## 2018-06-21 NOTE — Patient Instructions (Addendum)
Contraception Choices Contraception, also called birth control, refers to methods or devices that prevent pregnancy. Hormonal methods Contraceptive implant A contraceptive implant is a thin, plastic tube that contains a hormone. It is inserted into the upper part of the arm. It can remain in place for up to 3 years. Progestin-only injections Progestin-only injections are injections of progestin, a synthetic form of the hormone progesterone. They are given every 3 months by a health care provider. Birth control pills Birth control pills are pills that contain hormones that prevent pregnancy. They must be taken once a day, preferably at the same time each day. Birth control patch The birth control patch contains hormones that prevent pregnancy. It is placed on the skin and must be changed once a week for three weeks and removed on the fourth week. A prescription is needed to use this method of contraception. Vaginal ring A vaginal ring contains hormones that prevent pregnancy. It is placed in the vagina for three weeks and removed on the fourth week. After that, the process is repeated with a new ring. A prescription is needed to use this method of contraception. Emergency contraceptive Emergency contraceptives prevent pregnancy after unprotected sex. They come in pill form and can be taken up to 5 days after sex. They work best the sooner they are taken after having sex. Most emergency contraceptives are available without a prescription. This method should not be used as your only form of birth control. Barrier methods Female condom A female condom is a thin sheath that is worn over the penis during sex. Condoms keep sperm from going inside a woman's body. They can be used with a spermicide to increase their effectiveness. They should be disposed after a single use. Female condom A female condom is a soft, loose-fitting sheath that is put into the vagina before sex. The condom keeps sperm from going  inside a woman's body. They should be disposed after a single use. Diaphragm A diaphragm is a soft, dome-shaped barrier. It is inserted into the vagina before sex, along with a spermicide. The diaphragm blocks sperm from entering the uterus, and the spermicide kills sperm. A diaphragm should be left in the vagina for 6-8 hours after sex and removed within 24 hours. A diaphragm is prescribed and fitted by a health care provider. A diaphragm should be replaced every 1-2 years, after giving birth, after gaining more than 15 lb (6.8 kg), and after pelvic surgery. Cervical cap A cervical cap is a round, soft latex or plastic cup that fits over the cervix. It is inserted into the vagina before sex, along with spermicide. It blocks sperm from entering the uterus. The cap should be left in place for 6-8 hours after sex and removed within 48 hours. A cervical cap must be prescribed and fitted by a health care provider. It should be replaced every 2 years. Sponge A sponge is a soft, circular piece of polyurethane foam with spermicide on it. The sponge helps block sperm from entering the uterus, and the spermicide kills sperm. To use it, you make it wet and then insert it into the vagina. It should be inserted before sex, left in for at least 6 hours after sex, and removed and thrown away within 30 hours. Spermicides Spermicides are chemicals that kill or block sperm from entering the cervix and uterus. They can come as a cream, jelly, suppository, foam, or tablet. A spermicide should be inserted into the vagina with an applicator at least 10-15 minutes before   sex to allow time for it to work. The process must be repeated every time you have sex. Spermicides do not require a prescription. Intrauterine contraception Intrauterine device (IUD) An IUD is a T-shaped device that is put in a woman's uterus. There are two types:  Hormone IUD.This type contains progestin, a synthetic form of the hormone progesterone. This  type can stay in place for 3-5 years.  Copper IUD.This type is wrapped in copper wire. It can stay in place for 10 years.  Permanent methods of contraception Female tubal ligation In this method, a woman's fallopian tubes are sealed, tied, or blocked during surgery to prevent eggs from traveling to the uterus. Hysteroscopic sterilization In this method, a small, flexible insert is placed into each fallopian tube. The inserts cause scar tissue to form in the fallopian tubes and block them, so sperm cannot reach an egg. The procedure takes about 3 months to be effective. Another form of birth control must be used during those 3 months. Female sterilization This is a procedure to tie off the tubes that carry sperm (vasectomy). After the procedure, the man can still ejaculate fluid (semen). Natural planning methods Natural family planning In this method, a couple does not have sex on days when the woman could become pregnant. Calendar method This means keeping track of the length of each menstrual cycle, identifying the days when pregnancy can happen, and not having sex on those days. Ovulation method In this method, a couple avoids sex during ovulation. Symptothermal method This method involves not having sex during ovulation. The woman typically checks for ovulation by watching changes in her temperature and in the consistency of cervical mucus. Post-ovulation method In this method, a couple waits to have sex until after ovulation. Summary  Contraception, also called birth control, means methods or devices that prevent pregnancy.  Hormonal methods of contraception include implants, injections, pills, patches, vaginal rings, and emergency contraceptives.  Barrier methods of contraception can include female condoms, female condoms, diaphragms, cervical caps, sponges, and spermicides.  There are two types of IUDs (intrauterine devices). An IUD can be put in a woman's uterus to prevent pregnancy  for 3-5 years.  Permanent sterilization can be done through a procedure for males, females, or both.  Natural family planning methods involve not having sex on days when the woman could become pregnant. This information is not intended to replace advice given to you by your health care provider. Make sure you discuss any questions you have with your health care provider. Document Released: 08/28/2005 Document Revised: 09/30/2016 Document Reviewed: 09/30/2016 Elsevier Interactive Patient Education  2018 Elsevier Inc[employer]  

## 2018-06-28 ENCOUNTER — Ambulatory Visit (INDEPENDENT_AMBULATORY_CARE_PROVIDER_SITE_OTHER): Payer: Medicaid Other | Admitting: Student

## 2018-06-28 VITALS — BP 114/69 | HR 85 | Wt 202.6 lb

## 2018-06-28 DIAGNOSIS — Z3400 Encounter for supervision of normal first pregnancy, unspecified trimester: Secondary | ICD-10-CM

## 2018-06-28 NOTE — Patient Instructions (Signed)

## 2018-06-28 NOTE — Progress Notes (Signed)
   PRENATAL VISIT NOTE  Subjective:  Zoe Keller is a 27 y.o. G1P0000 at [redacted]w[redacted]d being seen today for ongoing prenatal care.  She is currently monitored for the following issues for this low-risk pregnancy and has Supervision of normal first pregnancy, antepartum; Low grade squamous intraepithelial lesion (LGSIL) on cervical Pap smear; and Excessive weight gain during pregnancy in second trimester on their problem list.  Patient reports no complaints.  Contractions: Not present. Vag. Bleeding: None.  Movement: Present. Denies leaking of fluid.   The following portions of the patient's history were reviewed and updated as appropriate: allergies, current medications, past family history, past medical history, past social history, past surgical history and problem list. Problem list updated.  Objective:   Vitals:   06/28/18 1222  BP: 114/69  Pulse: 85  Weight: 202 lb 9.6 oz (91.9 kg)    Fetal Status: Fetal Heart Rate (bpm): 149 Fundal Height: 38 cm Movement: Present     General:  Alert, oriented and cooperative. Patient is in no acute distress.  Skin: Skin is warm and dry. No rash noted.   Cardiovascular: Normal heart rate noted  Respiratory: Normal respiratory effort, no problems with respiration noted  Abdomen: Soft, gravid, appropriate for gestational age.  Pain/Pressure: Absent     Pelvic: Cervical exam deferred        Extremities: Normal range of motion.  Edema: None  Mental Status: Normal mood and affect. Normal behavior. Normal judgment and thought content.   Assessment and Plan:  Pregnancy: G1P0000 at [redacted]w[redacted]d  1. Supervision of normal first pregnancy, antepartum -undecided on contraception. Has list at home.  -doing well.   Term labor symptoms and general obstetric precautions including but not limited to vaginal bleeding, contractions, leaking of fluid and fetal movement were reviewed in detail with the patient. Please refer to After Visit Summary for other counseling  recommendations.  Return in about 1 week (around 07/05/2018) for Routine OB.  Future Appointments  Date Time Provider Department Center  07/05/2018 11:55 AM Judeth Horn, NP [facility]    Judeth Horn, NP

## 2018-07-05 ENCOUNTER — Ambulatory Visit (INDEPENDENT_AMBULATORY_CARE_PROVIDER_SITE_OTHER): Payer: Medicaid Other | Admitting: Student

## 2018-07-05 ENCOUNTER — Telehealth (HOSPITAL_COMMUNITY): Payer: Self-pay | Admitting: *Deleted

## 2018-07-05 VITALS — BP 128/68 | HR 92 | Wt 204.0 lb

## 2018-07-05 DIAGNOSIS — Z3400 Encounter for supervision of normal first pregnancy, unspecified trimester: Secondary | ICD-10-CM

## 2018-07-05 NOTE — Progress Notes (Signed)
   PRENATAL VISIT NOTE  Subjective:  Zoe Keller is a 27 y.o. G1P0000 at [redacted]w[redacted]d being seen today for ongoing prenatal care.  She is currently monitored for the following issues for this low-risk pregnancy and has Supervision of normal first pregnancy, antepartum; Low grade squamous intraepithelial lesion (LGSIL) on cervical Pap smear; and Excessive weight gain during pregnancy in second trimester on their problem list.  Patient reports no complaints.  Contractions: Not present. Vag. Bleeding: None.  Movement: Present. Denies leaking of fluid.   The following portions of the patient's history were reviewed and updated as appropriate: allergies, current medications, past family history, past medical history, past social history, past surgical history and problem list. Problem list updated.  Objective:   Vitals:   07/05/18 1208  BP: 128/68  Pulse: 92  Weight: 204 lb (92.5 kg)    Fetal Status: Fetal Heart Rate (bpm): 147 Fundal Height: 38 cm Movement: Present  Presentation: Vertex  General:  Alert, oriented and cooperative. Patient is in no acute distress.  Skin: Skin is warm and dry. No rash noted.   Cardiovascular: Normal heart rate noted  Respiratory: Normal respiratory effort, no problems with respiration noted  Abdomen: Soft, gravid, appropriate for gestational age.  Pain/Pressure: Absent     Pelvic: Cervical exam performed Dilation: Closed Effacement (%): Thick Station: -3  Extremities: Normal range of motion.  Edema: None  Mental Status: Normal mood and affect. Normal behavior. Normal judgment and thought content.   Assessment and Plan:  Pregnancy: G1P0000 at [redacted]w[redacted]d  1. Supervision of normal first pregnancy, antepartum -doing well -41 wk IOL scheduled. Reviewed different methods of induction with patient.   Term labor symptoms and general obstetric precautions including but not limited to vaginal bleeding, contractions, leaking of fluid and fetal movement were reviewed in  detail with the patient. Please refer to After Visit Summary for other counseling recommendations.  Return in about 1 week (around 07/12/2018) for Routine OB.  Future Appointments  Date Time Provider Department Center  07/15/2018  2:15 PM Armando Reichert, CNM [facility]  07/18/2018  7:30 AM [facility] ROOM [facility] None    Judeth Horn, NP

## 2018-07-05 NOTE — Patient Instructions (Signed)
Vaginal Delivery Vaginal delivery means that you will give birth by pushing your baby out of your birth canal (vagina). A team of health care providers will help you before, during, and after vaginal delivery. Birth experiences are unique for every woman and every pregnancy, and birth experiences vary depending on where you choose to give birth. What should I do to prepare for my baby's birth? Before your baby is born, it is important to talk with your health care provider about:  Your labor and delivery preferences. These may include: ? Medicines that you may be given. ? How you will manage your pain. This might include non-medical pain relief techniques or injectable pain relief such as epidural analgesia. ? How you and your baby will be monitored during labor and delivery. ? Who may be in the labor and delivery room with you. ? Your feelings about surgical delivery of your baby (cesarean delivery, or C-section) if this becomes necessary. ? Your feelings about receiving donated blood through an IV tube (blood transfusion) if this becomes necessary.  Whether you are able: ? To take pictures or videos of the birth. ? To eat during labor and delivery. ? To move around, walk, or change positions during labor and delivery.  What to expect after your baby is born, such as: ? Whether delayed umbilical cord clamping and cutting is offered. ? Who will care for your baby right after birth. ? Medicines or tests that may be recommended for your baby. ? Whether breastfeeding is supported in your hospital or birth center. ? How long you will be in the hospital or birth center.  How any medical conditions you have may affect your baby or your labor and delivery experience.  To prepare for your baby's birth, you should also:  Attend all of your health care visits before delivery (prenatal visits) as recommended by your health care provider. This is important.  Prepare your home for your baby's  arrival. Make sure that you have: ? Diapers. ? Baby clothing. ? Feeding equipment. ? Safe sleeping arrangements for you and your baby.  Install a car seat in your vehicle. Have your car seat checked by a certified car seat installer to make sure that it is installed safely.  Think about who will help you with your new baby at home for at least the first several weeks after delivery.  What can I expect when I arrive at the birth center or hospital? Once you are in labor and have been admitted into the hospital or birth center, your health care provider may:  Review your pregnancy history and any concerns you have.  Insert an IV tube into one of your veins. This is used to give you fluids and medicines.  Check your blood pressure, pulse, temperature, and heart rate (vital signs).  Check whether your bag of water (amniotic sac) has broken (ruptured).  Talk with you about your birth plan and discuss pain control options.  Monitoring Your health care provider may monitor your contractions (uterine monitoring) and your baby's heart rate (fetal monitoring). You may need to be monitored:  Often, but not continuously (intermittently).  All the time or for long periods at a time (continuously). Continuous monitoring may be needed if: ? You are taking certain medicines, such as medicine to relieve pain or make your contractions stronger. ? You have pregnancy or labor complications.  Monitoring may be done by:  Placing a special stethoscope or a handheld monitoring device on your abdomen to   check your baby's heartbeat, and feeling your abdomen for contractions. This method of monitoring does not continuously record your baby's heartbeat or your contractions.  Placing monitors on your abdomen (external monitors) to record your baby's heartbeat and the frequency and length of contractions. You may not have to wear external monitors all the time.  Placing monitors inside of your uterus  (internal monitors) to record your baby's heartbeat and the frequency, length, and strength of your contractions. ? Your health care provider may use internal monitors if he or she needs more information about the strength of your contractions or your baby's heart rate. ? Internal monitors are put in place by passing a thin, flexible wire through your vagina and into your uterus. Depending on the type of monitor, it may remain in your uterus or on your baby's head until birth. ? Your health care provider will discuss the benefits and risks of internal monitoring with you and will ask for your permission before inserting the monitors.  Telemetry. This is a type of continuous monitoring that can be done with external or internal monitors. Instead of having to stay in bed, you are able to move around during telemetry. Ask your health care provider if telemetry is an option for you.  Physical exam Your health care provider may perform a physical exam. This may include:  Checking whether your baby is positioned: ? With the head toward your vagina (head-down). This is most common. ? With the head toward the top of your uterus (head-up or breech). If your baby is in a breech position, your health care provider may try to turn your baby to a head-down position so you can deliver vaginally. If it does not seem that your baby can be born vaginally, your provider may recommend surgery to deliver your baby. In rare cases, you may be able to deliver vaginally if your baby is head-up (breech delivery). ? Lying sideways (transverse). Babies that are lying sideways cannot be delivered vaginally.  Checking your cervix to determine: ? Whether it is thinning out (effacing). ? Whether it is opening up (dilating). ? How low your baby has moved into your birth canal.  What are the three stages of labor and delivery?  Normal labor and delivery is divided into the following three stages: Stage 1  Stage 1 is the  longest stage of labor, and it can last for hours or days. Stage 1 includes: ? Early labor. This is when contractions may be irregular, or regular and mild. Generally, early labor contractions are more than 10 minutes apart. ? Active labor. This is when contractions get longer, more regular, more frequent, and more intense. ? The transition phase. This is when contractions happen very close together, are very intense, and may last longer than during any other part of labor.  Contractions generally feel mild, infrequent, and irregular at first. They get stronger, more frequent (about every 2-3 minutes), and more regular as you progress from early labor through active labor and transition.  Many women progress through stage 1 naturally, but you may need help to continue making progress. If this happens, your health care provider may talk with you about: ? Rupturing your amniotic sac if it has not ruptured yet. ? Giving you medicine to help make your contractions stronger and more frequent.  Stage 1 ends when your cervix is completely dilated to 4 inches (10 cm) and completely effaced. This happens at the end of the transition phase. Stage 2  Once   your cervix is completely effaced and dilated to 4 inches (10 cm), you may start to feel an urge to push. It is common for the body to naturally take a rest before feeling the urge to push, especially if you received an epidural or certain other pain medicines. This rest period may last for up to 1-2 hours, depending on your unique labor experience.  During stage 2, contractions are generally less painful, because pushing helps relieve contraction pain. Instead of contraction pain, you may feel stretching and burning pain, especially when the widest part of your baby's head passes through the vaginal opening (crowning).  Your health care provider will closely monitor your pushing progress and your baby's progress through the vagina during stage 2.  Your  health care provider may massage the area of skin between your vaginal opening and anus (perineum) or apply warm compresses to your perineum. This helps it stretch as the baby's head starts to crown, which can help prevent perineal tearing. ? In some cases, an incision may be made in your perineum (episiotomy) to allow the baby to pass through the vaginal opening. An episiotomy helps to make the opening of the vagina larger to allow more room for the baby to fit through.  It is very important to breathe and focus so your health care provider can control the delivery of your baby's head. Your health care provider may have you decrease the intensity of your pushing, to help prevent perineal tearing.  After delivery of your baby's head, the shoulders and the rest of the body generally deliver very quickly and without difficulty.  Once your baby is delivered, the umbilical cord may be cut right away, or this may be delayed for 1-2 minutes, depending on your baby's health. This may vary among health care providers, hospitals, and birth centers.  If you and your baby are healthy enough, your baby may be placed on your chest or abdomen to help maintain the baby's temperature and to help you bond with each other. Some mothers and babies start breastfeeding at this time. Your health care team will dry your baby and help keep your baby warm during this time.  Your baby may need immediate care if he or she: ? Showed signs of distress during labor. ? Has a medical condition. ? Was born too early (prematurely). ? Had a bowel movement before birth (meconium). ? Shows signs of difficulty transitioning from being inside the uterus to being outside of the uterus. If you are planning to breastfeed, your health care team will help you begin a feeding. Stage 3  The third stage of labor starts immediately after the birth of your baby and ends after you deliver the placenta. The placenta is an organ that develops  during pregnancy to provide oxygen and nutrients to your baby in the womb.  Delivering the placenta may require some pushing, and you may have mild contractions. Breastfeeding can stimulate contractions to help you deliver the placenta.  After the placenta is delivered, your uterus should tighten (contract) and become firm. This helps to stop bleeding in your uterus. To help your uterus contract and to control bleeding, your health care provider may: ? Give you medicine by injection, through an IV tube, by mouth, or through your rectum (rectally). ? Massage your abdomen or perform a vaginal exam to remove any blood clots that are left in your uterus. ? Empty your bladder by placing a thin, flexible tube (catheter) into your bladder. ? Encourage   you to breastfeed your baby. After labor is over, you and your baby will be monitored closely to ensure that you are both healthy until you are ready to go home. Your health care team will teach you how to care for yourself and your baby. This information is not intended to replace advice given to you by your health care provider. Make sure you discuss any questions you have with your health care provider. Document Released: 06/06/2008 Document Revised: 03/17/2016 Document Reviewed: 09/12/2015 [employer] Interactive Patient Education  2018 [employer]  

## 2018-07-05 NOTE — Telephone Encounter (Signed)
Preadmission screen  

## 2018-07-08 ENCOUNTER — Encounter: Payer: Self-pay | Admitting: *Deleted

## 2018-07-12 ENCOUNTER — Other Ambulatory Visit: Payer: Self-pay | Admitting: Family Medicine

## 2018-07-15 ENCOUNTER — Encounter: Payer: Self-pay | Admitting: Advanced Practice Midwife

## 2018-07-15 ENCOUNTER — Ambulatory Visit: Payer: Self-pay

## 2018-07-15 ENCOUNTER — Other Ambulatory Visit: Payer: Self-pay

## 2018-07-15 ENCOUNTER — Inpatient Hospital Stay (HOSPITAL_COMMUNITY)
Admission: AD | Admit: 2018-07-15 | Discharge: 2018-07-20 | DRG: 787 | Disposition: A | Discharge status: Home / Self Care | Payer: Medicaid Other | Attending: Obstetrics and Gynecology | Admitting: Obstetrics and Gynecology

## 2018-07-15 ENCOUNTER — Encounter (HOSPITAL_COMMUNITY): Payer: Self-pay

## 2018-07-15 ENCOUNTER — Ambulatory Visit (INDEPENDENT_AMBULATORY_CARE_PROVIDER_SITE_OTHER): Payer: Medicaid Other | Admitting: Advanced Practice Midwife

## 2018-07-15 VITALS — BP 133/84 | HR 95 | Wt 208.0 lb

## 2018-07-15 DIAGNOSIS — D649 Anemia, unspecified: Secondary | ICD-10-CM | POA: Diagnosis present

## 2018-07-15 DIAGNOSIS — O480 Post-term pregnancy: Secondary | ICD-10-CM

## 2018-07-15 DIAGNOSIS — Z3400 Encounter for supervision of normal first pregnancy, unspecified trimester: Secondary | ICD-10-CM

## 2018-07-15 DIAGNOSIS — Z3A40 40 weeks gestation of pregnancy: Secondary | ICD-10-CM | POA: Diagnosis not present

## 2018-07-15 DIAGNOSIS — O4103X0 Oligohydramnios, third trimester, not applicable or unspecified: Secondary | ICD-10-CM | POA: Diagnosis present

## 2018-07-15 DIAGNOSIS — O2603 Excessive weight gain in pregnancy, third trimester: Secondary | ICD-10-CM | POA: Diagnosis present

## 2018-07-15 DIAGNOSIS — O9902 Anemia complicating childbirth: Secondary | ICD-10-CM | POA: Diagnosis present

## 2018-07-15 DIAGNOSIS — O4100X0 Oligohydramnios, unspecified trimester, not applicable or unspecified: Secondary | ICD-10-CM | POA: Diagnosis present

## 2018-07-15 DIAGNOSIS — O99214 Obesity complicating childbirth: Secondary | ICD-10-CM | POA: Diagnosis present

## 2018-07-15 DIAGNOSIS — E669 Obesity, unspecified: Secondary | ICD-10-CM | POA: Diagnosis present

## 2018-07-15 LAB — CBC
HCT: 38 % (ref 36.0–46.0)
HEMOGLOBIN: 12.9 g/dL (ref 12.0–15.0)
MCH: 30.5 pg (ref 26.0–34.0)
MCHC: 33.9 g/dL (ref 30.0–36.0)
MCV: 89.8 fL (ref 80.0–100.0)
Platelets: 417 10*3/uL — ABNORMAL HIGH (ref 150–400)
RBC: 4.23 MIL/uL (ref 3.87–5.11)
RDW: 12.9 % (ref 11.5–15.5)
WBC: 13.8 10*3/uL — ABNORMAL HIGH (ref 4.0–10.5)
nRBC: 0 % (ref 0.0–0.2)

## 2018-07-15 LAB — ABO/RH: ABO/RH(D): O POS

## 2018-07-15 LAB — TYPE AND SCREEN
ABO/RH(D): O POS
Antibody Screen: NEGATIVE

## 2018-07-15 IMAGING — US US FETAL BPP W/ NON-STRESS
1 series · 13 of 14 positions shown · non-contrast
Comparison: none

[Series 1: us fetal bpp w/nonstress · 14 acquisitions, 13 frames shown]
[im 1/14]
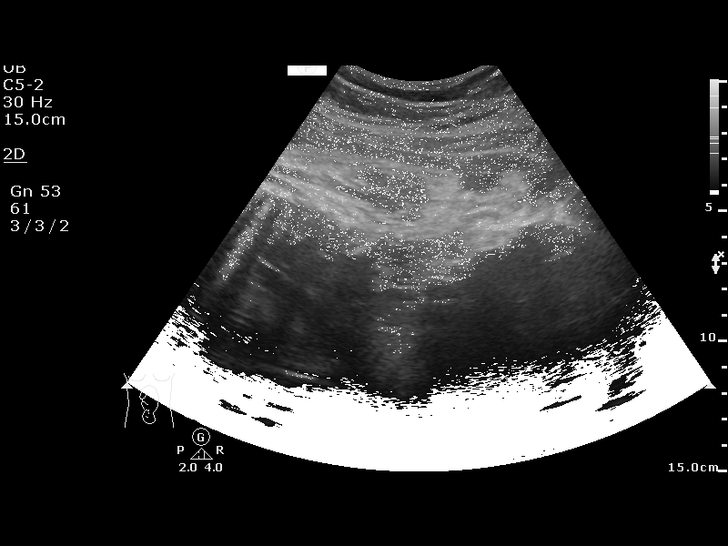
[im 2/14]
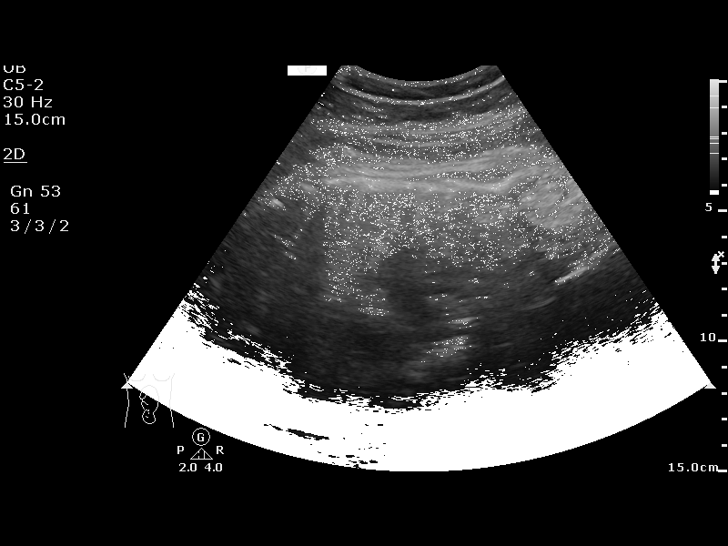
[im 3/14]
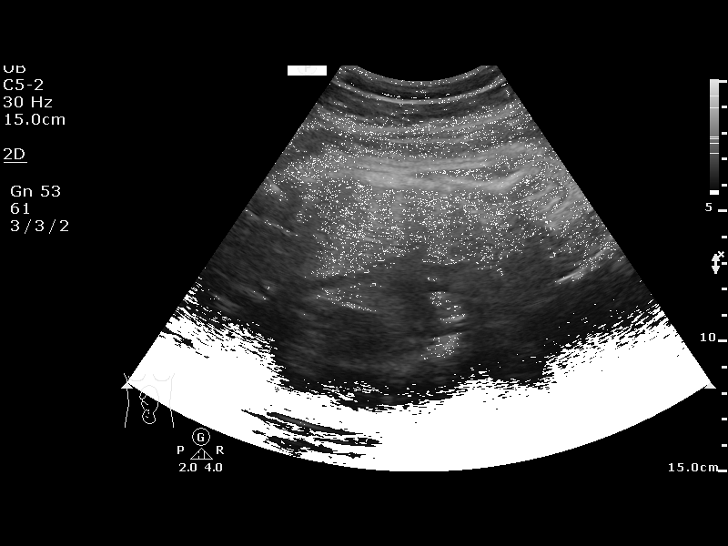
[im 4/14]
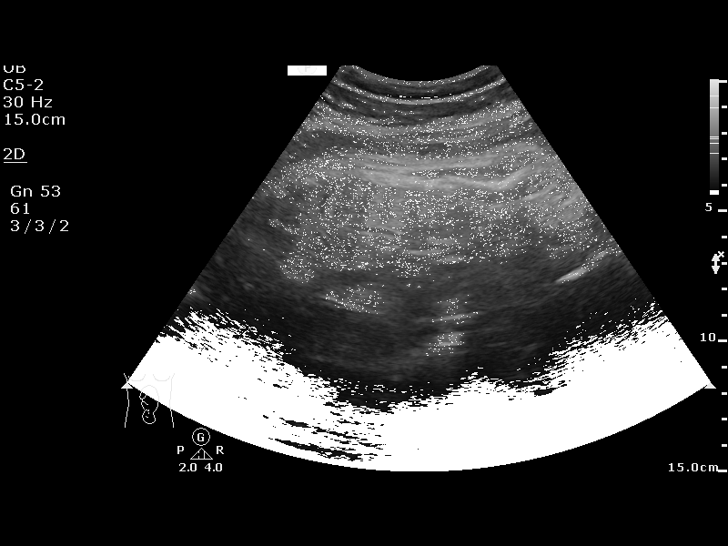
[im 5/14]
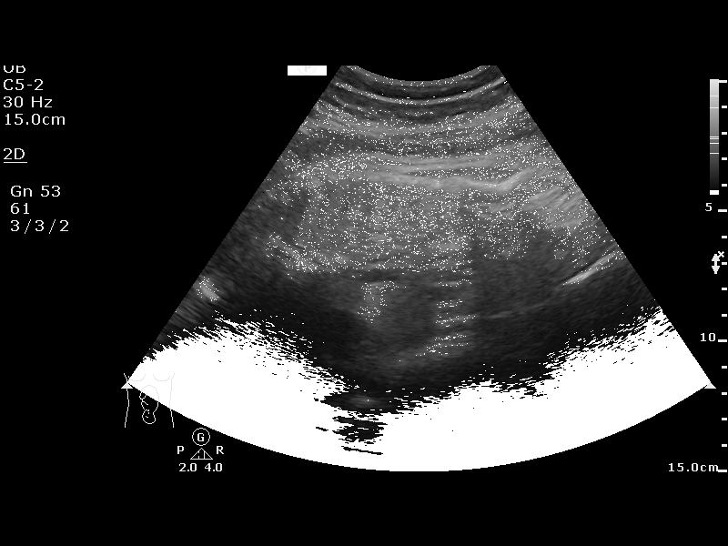
[im 6/14]
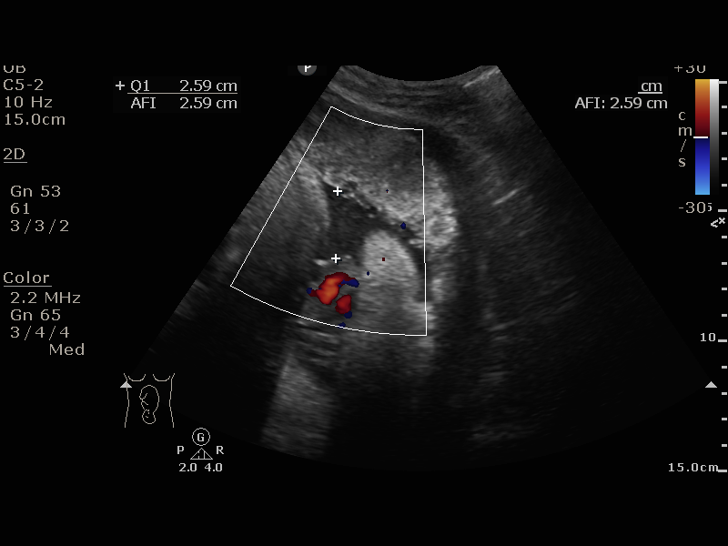
[im 8/14]
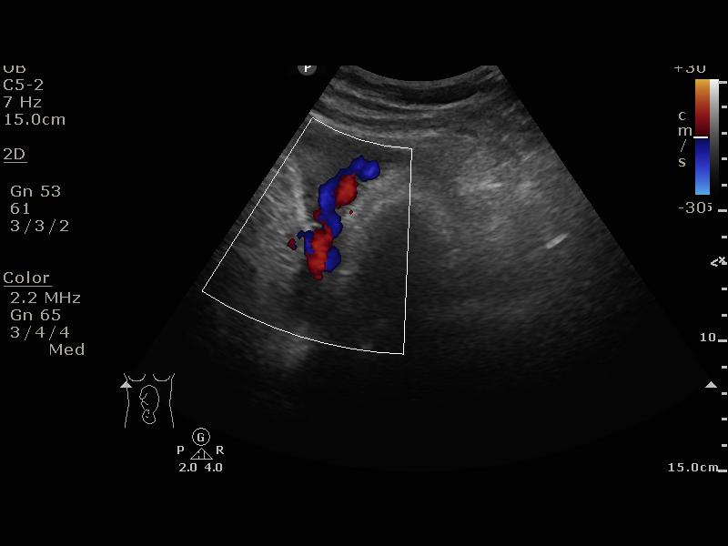
[im 9/14]
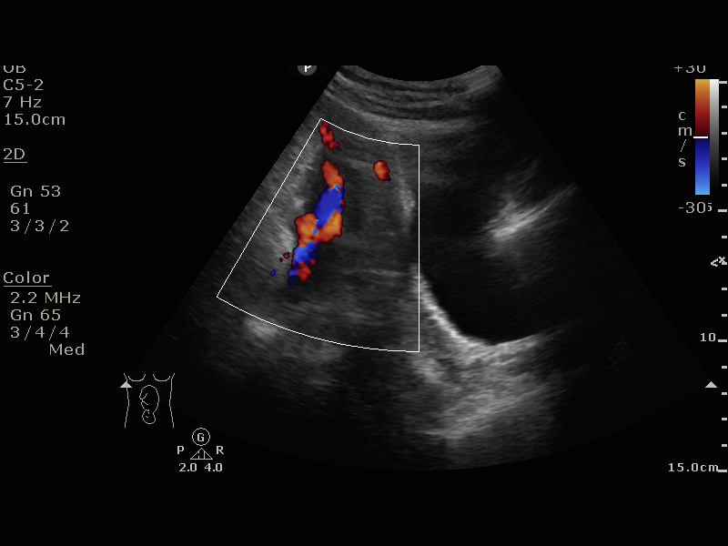
[im 10/14]
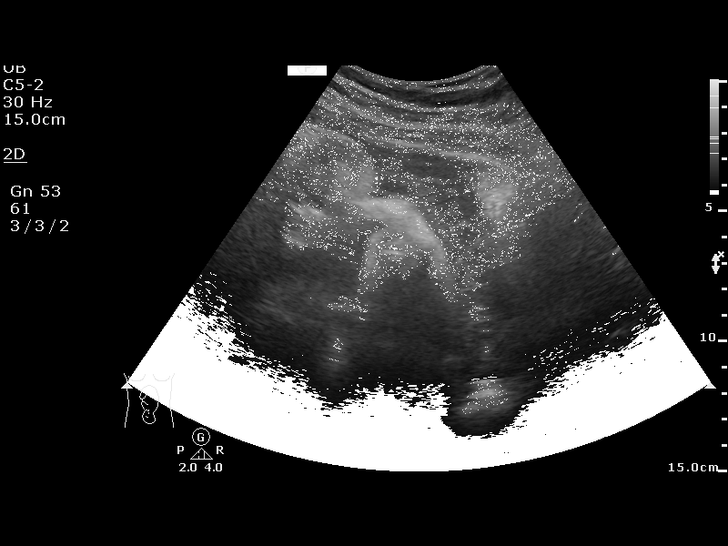
[im 11/14]
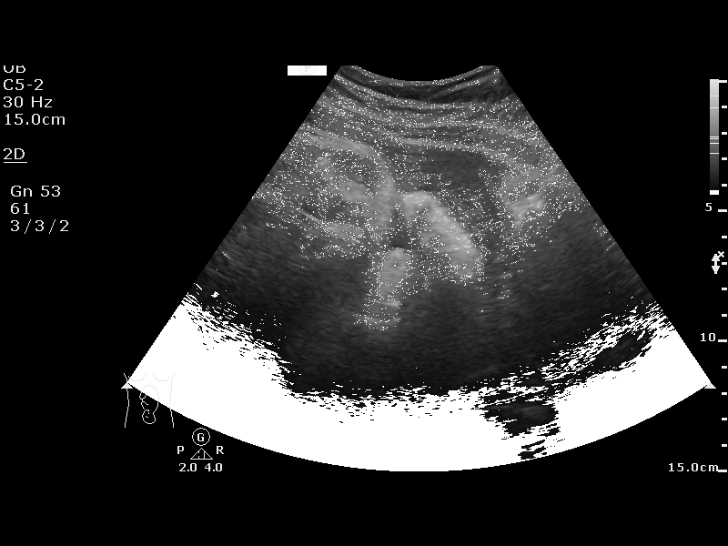
[im 12/14]
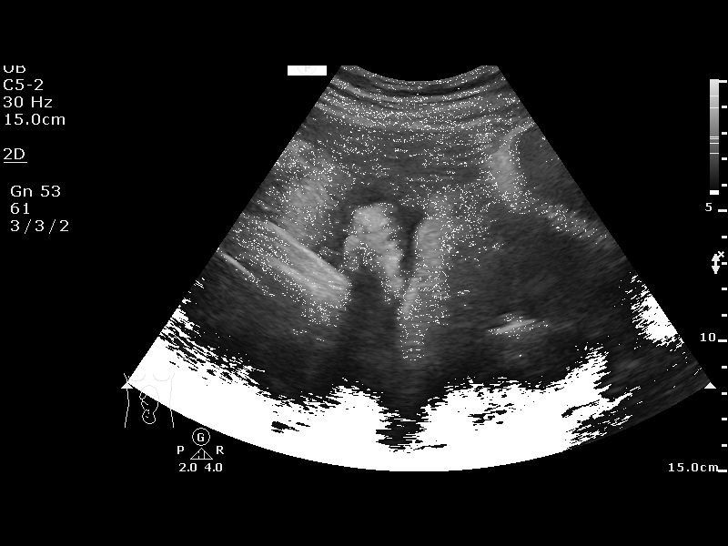
[im 13/14]
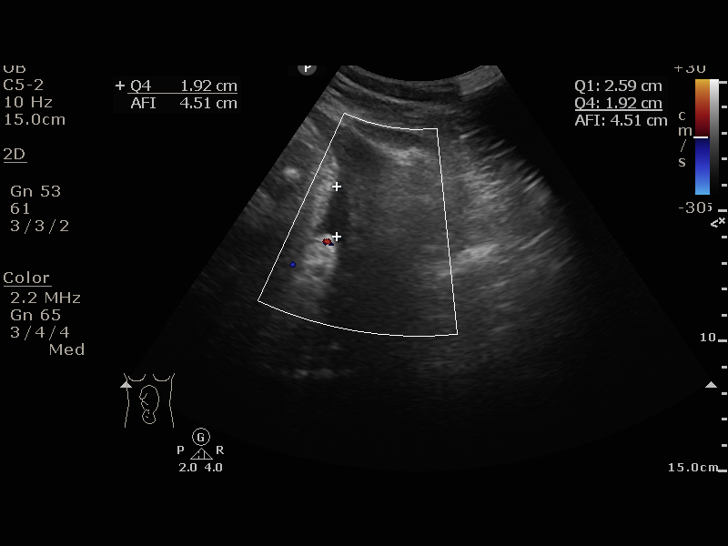
[im 14/14]
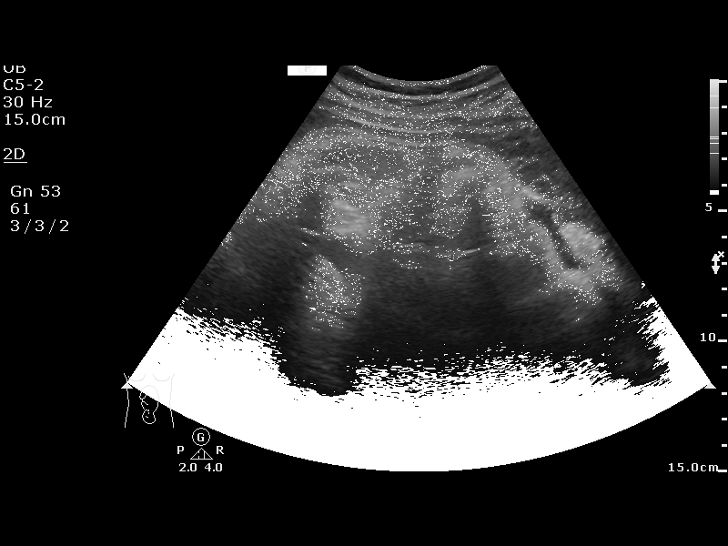

[13 of 14 positions shown; findings below may reference images not displayed]

[facility]
                                                            [facility]
                                                            [REDACTED]

 ----------------------------------------------------------------------

 ----------------------------------------------------------------------
Indications

  40 weeks gestation of pregnancy
  Postdate pregnancy (40-42 weeks)               [TY]
 ----------------------------------------------------------------------
Fetal Evaluation

 Num Of Fetuses:         1
 Preg. Location:         Intrauterine
 Cardiac Activity:       Observed
 Presentation:           Cephalic

 Amniotic Fluid
 AFI FV:      Oligohydramnios

 AFI Sum(cm)     %Tile       Largest Pocket(cm)
 4.51            < 3

 RUQ(cm)       RLQ(cm)       LUQ(cm)        LLQ(cm)
 2.59          1.92          0              0
Biophysical Evaluation

 Amniotic F.V:   Pocket < 2 cm two          F. Tone:        Observed
                 planes
 F. Movement:    Observed                   N.S.T:          Reactive
 F. Breathing:   Observed                   Score:          [DATE]
OB History

 Gravidity:    1         Term:   0        Prem:   0        SAB:   0
 TOP:          0       Ectopic:  0        Living: 0
Gestational Age

 LMP:           40w 4d        Date:  [DATE]                 EDD:   [DATE]
 Best:          40w 4d     Det. By:  LMP  ([DATE])          EDD:   [DATE]
Impression

 Oligohydramnios
 Fetal bradycardia initially when on NST, but recovered to
 baseline with accelerations
Recommendations

 To BILLIOT for induction of labor.
                 BILLIOT, DO

## 2018-07-15 MED ORDER — FLEET ENEMA 7-19 GM/118ML RE ENEM: 1.0000 | ENEMA | RECTAL | Status: DC | PRN

## 2018-07-15 MED ORDER — LACTATED RINGERS IV SOLN
500.0000 mL | INTRAVENOUS | Status: DC | PRN
  Administered 2018-07-15 (×2): 500 mL via INTRAVENOUS
  Administered 2018-07-16: 1000 mL via INTRAVENOUS

## 2018-07-15 MED ORDER — OXYTOCIN 40 UNITS IN LACTATED RINGERS INFUSION - SIMPLE MED: 2.5000 [IU]/h | INTRAVENOUS | Status: DC

## 2018-07-15 MED ORDER — LIDOCAINE HCL (PF) 1 % IJ SOLN: 30.0000 mL | INTRAMUSCULAR | Status: DC | PRN

## 2018-07-15 MED ORDER — ONDANSETRON HCL 4 MG/2ML IJ SOLN
4.0000 mg | Freq: Four times a day (QID) | INTRAMUSCULAR | Status: DC | PRN
  Administered 2018-07-16: 4 mg via INTRAVENOUS
  Filled 2018-07-15: qty 2

## 2018-07-15 MED ORDER — FENTANYL CITRATE (PF) 100 MCG/2ML IJ SOLN
100.0000 ug | INTRAMUSCULAR | Status: DC | PRN
  Administered 2018-07-16 (×3): 100 ug via INTRAVENOUS
  Filled 2018-07-15 (×3): qty 2

## 2018-07-15 MED ORDER — LACTATED RINGERS IV SOLN
INTRAVENOUS | Status: DC
  Administered 2018-07-15 – 2018-07-17 (×9): via INTRAVENOUS

## 2018-07-15 MED ORDER — TERBUTALINE SULFATE 1 MG/ML IJ SOLN
0.2500 mg | Freq: Once | INTRAMUSCULAR | Status: AC | PRN
  Administered 2018-07-15: 0.25 mg via SUBCUTANEOUS
  Filled 2018-07-15 (×2): qty 1

## 2018-07-15 MED ORDER — MISOPROSTOL 50MCG HALF TABLET
50.0000 ug | ORAL_TABLET | ORAL | Status: DC | PRN
  Administered 2018-07-15: 50 ug via BUCCAL
  Filled 2018-07-15: qty 1

## 2018-07-15 MED ORDER — SOD CITRATE-CITRIC ACID 500-334 MG/5ML PO SOLN
30.0000 mL | ORAL | Status: DC | PRN
  Administered 2018-07-17: 30 mL via ORAL
  Filled 2018-07-15 (×2): qty 15

## 2018-07-15 MED ORDER — OXYTOCIN BOLUS FROM INFUSION: 500.0000 mL | Freq: Once | INTRAVENOUS | Status: DC

## 2018-07-15 NOTE — Patient Instructions (Signed)
Vaginal delivery means that you will give birth by pushing your baby out of your birth canal (vagina). A team of health care providers will help you before, during, and after vaginal delivery. Birth experiences are unique for every woman and every pregnancy, and birth experiences vary depending on where you choose to give birth. What should I do to prepare for my baby's birth? Before your baby is born, it is important to talk with your health care provider about:  Your labor and delivery preferences. These may include: ? Medicines that you may be given. ? How you will manage your pain. This might include non-medical pain relief techniques or injectable pain relief such as epidural analgesia. ? How you and your baby will be monitored during labor and delivery. ? Who may be in the labor and delivery room with you. ? Your feelings about surgical delivery of your baby (cesarean delivery, or C-section) if this becomes necessary. ? Your feelings about receiving donated blood through an IV tube (blood transfusion) if this becomes necessary.  Whether you are able: ? To take pictures or videos of the birth. ? To eat during labor and delivery. ? To move around, walk, or change positions during labor and delivery.  What to expect after your baby is born, such as: ? Whether delayed umbilical cord clamping and cutting is offered. ? Who will care for your baby right after birth. ? Medicines or tests that may be recommended for your baby. ? Whether breastfeeding is supported in your hospital or birth center. ? How long you will be in the hospital or birth center.  How any medical conditions you have may affect your baby or your labor and delivery experience.  To prepare for your baby's birth, you should also:  Attend all of your health care visits before delivery (prenatal visits) as recommended by your health care provider. This is important.  Prepare your home for your baby's arrival. Make sure  that you have: ? Diapers. ? Baby clothing. ? Feeding equipment. ? Safe sleeping arrangements for you and your baby.  Install a car seat in your vehicle. Have your car seat checked by a certified car seat installer to make sure that it is installed safely.  Think about who will help you with your new baby at home for at least the first several weeks after delivery.  What can I expect when I arrive at the birth center or hospital? Once you are in labor and have been admitted into the hospital or birth center, your health care provider may:  Review your pregnancy history and any concerns you have.  Insert an IV tube into one of your veins. This is used to give you fluids and medicines.  Check your blood pressure, pulse, temperature, and heart rate (vital signs).  Check whether your bag of water (amniotic sac) has broken (ruptured).  Talk with you about your birth plan and discuss pain control options.  Monitoring Your health care provider may monitor your contractions (uterine monitoring) and your baby's heart rate (fetal monitoring). You may need to be monitored:  Often, but not continuously (intermittently).  All the time or for long periods at a time (continuously). Continuous monitoring may be needed if: ? You are taking certain medicines, such as medicine to relieve pain or make your contractions stronger. ? You have pregnancy or labor complications.  Monitoring may be done by:  Placing a special stethoscope or a handheld monitoring device on your abdomen to check your   baby's heartbeat, and feeling your abdomen for contractions. This method of monitoring does not continuously record your baby's heartbeat or your contractions.  Placing monitors on your abdomen (external monitors) to record your baby's heartbeat and the frequency and length of contractions. You may not have to wear external monitors all the time.  Placing monitors inside of your uterus (internal monitors) to  record your baby's heartbeat and the frequency, length, and strength of your contractions. ? Your health care provider may use internal monitors if he or she needs more information about the strength of your contractions or your baby's heart rate. ? Internal monitors are put in place by passing a thin, flexible wire through your vagina and into your uterus. Depending on the type of monitor, it may remain in your uterus or on your baby's head until birth. ? Your health care provider will discuss the benefits and risks of internal monitoring with you and will ask for your permission before inserting the monitors.  Telemetry. This is a type of continuous monitoring that can be done with external or internal monitors. Instead of having to stay in bed, you are able to move around during telemetry. Ask your health care provider if telemetry is an option for you.  Physical exam Your health care provider may perform a physical exam. This may include:  Checking whether your baby is positioned: ? With the head toward your vagina (head-down). This is most common. ? With the head toward the top of your uterus (head-up or breech). If your baby is in a breech position, your health care provider may try to turn your baby to a head-down position so you can deliver vaginally. If it does not seem that your baby can be born vaginally, your provider may recommend surgery to deliver your baby. In rare cases, you may be able to deliver vaginally if your baby is head-up (breech delivery). ? Lying sideways (transverse). Babies that are lying sideways cannot be delivered vaginally.  Checking your cervix to determine: ? Whether it is thinning out (effacing). ? Whether it is opening up (dilating). ? How low your baby has moved into your birth canal.  What are the three stages of labor and delivery?  Normal labor and delivery is divided into the following three stages: Stage 1  Stage 1 is the longest stage of labor,  and it can last for hours or days. Stage 1 includes: ? Early labor. This is when contractions may be irregular, or regular and mild. Generally, early labor contractions are more than 10 minutes apart. ? Active labor. This is when contractions get longer, more regular, more frequent, and more intense. ? The transition phase. This is when contractions happen very close together, are very intense, and may last longer than during any other part of labor.  Contractions generally feel mild, infrequent, and irregular at first. They get stronger, more frequent (about every 2-3 minutes), and more regular as you progress from early labor through active labor and transition.  Many women progress through stage 1 naturally, but you may need help to continue making progress. If this happens, your health care provider may talk with you about: ? Rupturing your amniotic sac if it has not ruptured yet. ? Giving you medicine to help make your contractions stronger and more frequent.  Stage 1 ends when your cervix is completely dilated to 4 inches (10 cm) and completely effaced. This happens at the end of the transition phase. Stage 2  Once your cervix   is completely effaced and dilated to 4 inches (10 cm), you may start to feel an urge to push. It is common for the body to naturally take a rest before feeling the urge to push, especially if you received an epidural or certain other pain medicines. This rest period may last for up to 1-2 hours, depending on your unique labor experience.  During stage 2, contractions are generally less painful, because pushing helps relieve contraction pain. Instead of contraction pain, you may feel stretching and burning pain, especially when the widest part of your baby's head passes through the vaginal opening (crowning).  Your health care provider will closely monitor your pushing progress and your baby's progress through the vagina during stage 2.  Your health care provider may  massage the area of skin between your vaginal opening and anus (perineum) or apply warm compresses to your perineum. This helps it stretch as the baby's head starts to crown, which can help prevent perineal tearing. ? In some cases, an incision may be made in your perineum (episiotomy) to allow the baby to pass through the vaginal opening. An episiotomy helps to make the opening of the vagina larger to allow more room for the baby to fit through.  It is very important to breathe and focus so your health care provider can control the delivery of your baby's head. Your health care provider may have you decrease the intensity of your pushing, to help prevent perineal tearing.  After delivery of your baby's head, the shoulders and the rest of the body generally deliver very quickly and without difficulty.  Once your baby is delivered, the umbilical cord may be cut right away, or this may be delayed for 1-2 minutes, depending on your baby's health. This may vary among health care providers, hospitals, and birth centers.  If you and your baby are healthy enough, your baby may be placed on your chest or abdomen to help maintain the baby's temperature and to help you bond with each other. Some mothers and babies start breastfeeding at this time. Your health care team will dry your baby and help keep your baby warm during this time.  Your baby may need immediate care if he or she: ? Showed signs of distress during labor. ? Has a medical condition. ? Was born too early (prematurely). ? Had a bowel movement before birth (meconium). ? Shows signs of difficulty transitioning from being inside the uterus to being outside of the uterus. If you are planning to breastfeed, your health care team will help you begin a feeding. Stage 3  The third stage of labor starts immediately after the birth of your baby and ends after you deliver the placenta. The placenta is an organ that develops during pregnancy to provide  oxygen and nutrients to your baby in the womb.  Delivering the placenta may require some pushing, and you may have mild contractions. Breastfeeding can stimulate contractions to help you deliver the placenta.  After the placenta is delivered, your uterus should tighten (contract) and become firm. This helps to stop bleeding in your uterus. To help your uterus contract and to control bleeding, your health care provider may: ? Give you medicine by injection, through an IV tube, by mouth, or through your rectum (rectally). ? Massage your abdomen or perform a vaginal exam to remove any blood clots that are left in your uterus. ? Empty your bladder by placing a thin, flexible tube (catheter) into your bladder. ? Encourage you to   breastfeed your baby. After labor is over, you and your baby will be monitored closely to ensure that you are both healthy until you are ready to go home. Your health care team will teach you how to care for yourself and your baby. This information is not intended to replace advice given to you by your health care provider. Make sure you discuss any questions you have with your health care provider. Document Released: 06/06/2008 Document Revised: 03/17/2016 Document Reviewed: 09/12/2015 Elsevier Interactive Patient Education  2018 [employer]  

## 2018-07-15 NOTE — Progress Notes (Signed)
   PRENATAL VISIT NOTE  Subjective:  Zoe Keller is a 27 y.o. G1P0000 at [redacted]w[redacted]d being seen today for ongoing prenatal care.  She is currently monitored for the following issues for this low-risk pregnancy and has Supervision of normal first pregnancy, antepartum; Low grade squamous intraepithelial lesion (LGSIL) on cervical Pap smear; Excessive weight gain during pregnancy in second trimester; and Obesity (BMI 30-39.9) on their problem list.  Patient reports no complaints.  Contractions: Not present. Vag. Bleeding: None.  Movement: Present. Denies leaking of fluid.   The following portions of the patient's history were reviewed and updated as appropriate: allergies, current medications, past family history, past medical history, past social history, past surgical history and problem list. Problem list updated.  Objective:   Vitals:   07/15/18 1429  BP: 133/84  Pulse: 95  Weight: 208 lb (94.3 kg)    Fetal Status: Fetal Heart Rate (bpm): NST Fundal Height: 41 cm Movement: Present     General:  Alert, oriented and cooperative. Patient is in no acute distress.  Skin: Skin is warm and dry. No rash noted.   Cardiovascular: Normal heart rate noted  Respiratory: Normal respiratory effort, no problems with respiration noted  Abdomen: Soft, gravid, appropriate for gestational age.  Pain/Pressure: Absent     Pelvic: Cervical exam deferred        Extremities: Normal range of motion.  Edema: None  Mental Status: Normal mood and affect. Normal behavior. Normal judgment and thought content.   Assessment and Plan:  Pregnancy: G1P0000 at [redacted]w[redacted]d  1. Supervision of normal first pregnancy, antepartum - Routine care - BPP/NST today - IOL scheduled for 07/18/18   Term labor symptoms and general obstetric precautions including but not limited to vaginal bleeding, contractions, leaking of fluid and fetal movement were reviewed in detail with the patient. Please refer to After Visit Summary for  other counseling recommendations.  No follow-ups on file.  Future Appointments  Date Time Provider Department Center  07/18/2018  7:30 AM [facility] RO[facility] None    Thressa Sheller, CNM

## 2018-07-15 NOTE — Progress Notes (Signed)
LABOR PROGRESS NOTE  Ariyah Sedlack is a 27 y.o. G1P0000 at [redacted]w[redacted]d  admitted for IOL due to fetal decelerations during NST in office today.   Subjective: Called to room due to prolonged deceleration. Doing well, no complaints.   Objective: BP 114/71   Pulse 81   Temp 98.2 F (36.8 C) (Oral)   Resp 15   Ht 5\' 3"  (1.6 m)   Wt 94.3 kg   LMP 10/04/2017 (Exact Date)   BMI 36.85 kg/m  or  Vitals:   07/15/18 1630 07/15/18 1713 07/15/18 1748  BP: 129/79 128/80 114/71  Pulse: 87 86 81  Resp: 18 20 15   Temp: 98.2 F (36.8 C) 98.2 F (36.8 C)   TempSrc: Oral Oral   Weight:  94.3 kg   Height:  5\' 3"  (1.6 m)     Dilation: Closed Effacement (%): Thick Station: -3 Presentation: Vertex Exam by:: s grindstaff rn  FHT: baseline rate 135, moderate varibility, +acel, s/p prolonged decel ~ 5 min, improved to baseline  Toco: irritable, irregular   Labs: Lab Results  Component Value Date   WBC 13.8 (H) 07/15/2018   HGB 12.9 07/15/2018   HCT 38.0 07/15/2018   MCV 89.8 07/15/2018   PLT 417 (H) 07/15/2018    Patient Active Problem List   Diagnosis Date Noted  . Post-dates pregnancy 07/15/2018  . Excessive weight gain during pregnancy in second trimester 02/01/2018  . Low grade squamous intraepithelial lesion (LGSIL) on cervical Pap smear 01/08/2018  . Supervision of normal first pregnancy, antepartum 01/01/2018  . Obesity (BMI 30-39.9) 05/01/2016    Assessment / Plan: 27 y.o. G1P0000 at [redacted]w[redacted]d here for IOL due to fetal decelerations during NST in office today.   Labor: IOL with cytotec. Serial cervical exams.  Fetal Wellbeing:  S/p prolonged deceleration (5 min) with nadir in 70's, resolved with position change on R lateral side. Will monitor closely, Cat 1 strip currently with good variability.  Pain Control:  Planning on epidural Anticipated MOD:  NSVD   Leticia Penna, D.O. Family Medicine PGY-1  07/15/2018, 6:37 PM

## 2018-07-15 NOTE — MAU Note (Signed)
Sent up from clinic for brady episode while monitoring  Pt states she is not feeling ctx but was told in the clinic that she was having ctx  No bleeding, no LOF, +FM

## 2018-07-15 NOTE — Progress Notes (Signed)
LABOR PROGRESS NOTE  Zoe Keller is a 27 y.o. G1P0000 at [redacted]w[redacted]d  admitted for IOL due to fetal decel during NST in office   Subjective: Called to room due to prolonged decel. FHT went back up with positional change to all 4s.   Objective: BP (!) 102/58   Pulse 92   Temp 97.8 F (36.6 C) (Oral)   Resp 16   Ht 5\' 3"  (1.6 m)   Wt 94.3 kg   LMP 10/04/2017 (Exact Date)   SpO2 100%   BMI 36.85 kg/m  or  Vitals:   07/15/18 2044 07/15/18 2045 07/15/18 2052 07/15/18 2200  BP:    (!) 102/58  Pulse:    92  Resp:      Temp:      TempSrc:      SpO2: 100% 100% 100%   Weight:      Height:        Dilation: Closed Effacement (%): Thick Station: -3 Presentation: Vertex Exam by:: s grindstaff rn  FHT: baseline rate 165, moderate varibility, +acel, + variable and late decel Toco: q67min  Labs: Lab Results  Component Value Date   WBC 13.8 (H) 07/15/2018   HGB 12.9 07/15/2018   HCT 38.0 07/15/2018   MCV 89.8 07/15/2018   PLT 417 (H) 07/15/2018    Patient Active Problem List   Diagnosis Date Noted  . Post-dates pregnancy 07/15/2018  . Excessive weight gain during pregnancy in second trimester 02/01/2018  . Low grade squamous intraepithelial lesion (LGSIL) on cervical Pap smear 01/08/2018  . Supervision of normal first pregnancy, antepartum 01/01/2018  . Obesity (BMI 30-39.9) 05/01/2016    Assessment / Plan: 27 y.o. G1P0000 at [redacted]w[redacted]d here for IOL for fetal decel   Labor: s/p cytotec.  Fetal Wellbeing:  S/p prolonged decel (4-87min), resolved w positional change to hands and knee. Cat 2 Pain Control:  Planning on epidural  Anticipated MOD:  NSVD  Oralia Manis, DO PGY-2 07/15/2018, 10:13 PM

## 2018-07-15 NOTE — Anesthesia Pain Management Evaluation Note (Signed)
  [facility] Pain Management Visit Note  Patient: Zoe Keller, 27 y.o., female  "Hello I am a member of the anesthesia team at [facility]. We have an anesthesia team available at all times to provide care throughout the hospital, including epidural management and anesthesia for C-section. I don't know your plan for the delivery whether it a natural birth, water birth, IV sedation, nitrous supplementation, doula or epidural, but we want to meet your pain goals."   1.Was your pain managed to your expectations on prior hospitalizations?   No prior hospitalizations  2.What is your expectation for pain management during this hospitalization?     Epidural  3.How can we help you reach that goal? epidural  Record the patient's initial score and the patient's pain goal.   Pain: 0  Pain Goal: 5 [facility] wants you to be able to say your pain was always managed very well.  Dorine Duffey 07/15/2018

## 2018-07-15 NOTE — H&P (Signed)
LABOR AND DELIVERY ADMISSION HISTORY AND PHYSICAL NOTE  Zoe Keller is a 27 y.o. female G1P0000 with IUP at [redacted]w[redacted]d by LMP presenting from clinic for IOL due to fetal bradycardia on NST.   She reports positive fetal movement. She denies leakage of fluid or vaginal bleeding. Not feeling any contractions at this time.   Prenatal History/Complications: PNC at [facility]. Pregnancy complications: - Fetal bradycardia on US fetal BPP w/NST 07/15/18 - Otherwise uncomplicated pregnancy  Past Medical History: Past Medical History:  Diagnosis Date  . Medical history non-contributory     Past Surgical History: Past Surgical History:  Procedure Laterality Date  . HERNIA REPAIR      Obstetrical History: OB History    Gravida  1   Para  0   Term  0   Preterm  0   AB  0   Living  0     SAB  0   TAB  0   Ectopic  0   Multiple  0   Live Births  0           Social History: Social History   Socioeconomic History  . Marital status: Single    Spouse name: Not on file  . Number of children: Not on file  . Years of education: Not on file  . Highest education level: Not on file  Occupational History  . Not on file  Social Needs  . Financial resource strain: Not on file  . Food insecurity:    Worry: Not on file    Inability: Not on file  . Transportation needs:    Medical: Not on file    Non-medical: Not on file  Tobacco Use  . Smoking status: Never Smoker  . Smokeless tobacco: Never Used  Substance and Sexual Activity  . Alcohol use: Never    Frequency: Never  . Drug use: Never  . Sexual activity: Yes    Comment: [employer]  Lifestyle  . Physical activity:    Days per week: Not on file    Minutes per session: Not on file  . Stress: Not on file  Relationships  . Social connections:    Talks on phone: Not on file    Gets together: Not on file    Attends religious service: Not on file    Active member of club or organization: Not on file    Attends meetings  of clubs or organizations: Not on file    Relationship status: Not on file  Other Topics Concern  . Not on file  Social History Narrative  . Not on file    Family History: Family History  Problem Relation Age of Onset  . Diabetes Maternal Grandmother     Allergies: No Known Allergies  Medications Prior to Admission  Medication Sig Dispense Refill Last Dose  . ondansetron (ZOFRAN ODT) 4 MG disintegrating tablet Take 1 tablet (4 mg total) by mouth every 8 (eight) hours as needed for nausea or vomiting. 30 tablet 0 Taking  . Prenatal Vit-Fe Fumarate-FA (MULTIVITAMIN-PRENATAL) 27-0.8 MG TABS tablet Take 1 tablet by mouth daily at 12 noon.   Taking     Review of Systems  All systems reviewed and negative except as stated in HPI  Physical Exam Blood pressure 128/80, pulse 86, temperature 98.2 F (36.8 C), temperature source Oral, resp. rate 20, height 5\' 3"  (1.6 m), weight 94.3 kg, last menstrual period 10/04/2017. General appearance: alert, oriented, NAD Lungs: normal respiratory effort Heart: regular rate  Abdomen: soft, non-tender; gravid, FH appropriate for GA Extremities: No calf swelling or tenderness Presentation: cephalic Fetal monitoring: Baseline FHR 130 bpm with moderate variability, +accelerations, -decelerations. Uterine activity: Uterine contractions every 2-3 minutes    Prenatal labs: ABO, Rh: O/Positive/-- (04/23 1134) Antibody: Negative (04/23 1134) Rubella: 3.11 (04/23 1134) RPR: Non Reactive (08/20 0900)  HBsAg: Negative (04/23 1134)  HIV: Non Reactive (08/20 0900)  GC/Chlamydia: Negative GBS: Negative (10/03 0000)  2-hr GTT: Normal Genetic screening:  Panorama Low Risk Anatomy US: Normal  Prenatal Transfer Tool  Maternal Diabetes: No Genetic Screening: Normal Maternal Ultrasounds/Referrals: Normal Fetal Ultrasounds or other Referrals:  None Maternal Substance Abuse:  No Significant Maternal Medications:  None Significant Maternal Lab Results:  None  No results found for this or any previous visit (from the past 24 hour(s)).  Patient Active Problem List   Diagnosis Date Noted  . Post-dates pregnancy 07/15/2018  . Excessive weight gain during pregnancy in second trimester 02/01/2018  . Low grade squamous intraepithelial lesion (LGSIL) on cervical Pap smear 01/08/2018  . Supervision of normal first pregnancy, antepartum 01/01/2018  . Obesity (BMI 30-39.9) 05/01/2016    Assessment: Zoe Keller is a 27 y.o. G1P0000 at [redacted]w[redacted]d who presented from clinic for IOL due to initial fetal bradycardia on US fetal BPP w/NST. FHR then recovered to baseline with accelerations. Cervix closed on initial check.  #Labor:  -- Cytotec 50 mcg buccal q4hrs PRN    #Pain: Epidural during delivery.  #FWB: Baseline FHR 130 with moderate variability, +accelerations, -decelerations. Uterine contractions every 2-4 minutes.  #ID:  GBS negative 06/13/18  #MOF: Breast  #MOC: IUD  Hal Neer 07/15/2018, 5:29 PM    OB FELLOW MEDICAL STUDENT NOTE ATTESTATION  I confirm that I have verified the information documented in the medical student's note and that I have also personally performed the physical exam and all medical decision making activities.   Zoe Keller is 27 y.o. G1P0000 female at [redacted]w[redacted]d who presents for IOL for fetal bradycardia noted on NST at clinic today. Pregnancy has otherwise been uncomplicated. FHT Cat I with uterine ctx q2-4 min at admission. Patient is GBS neg. Plans to breast feed and use IUD for contraception.  Cervix 0/thick. Vertex presentation. Will start induction with Cytotec 50 mg PO. Discussed pain control options; patient will likely get epidural. Has a history of LSIL +HPV Pap, will need PP colposcopy.   Marcy Siren, D.O. OB Fellow  07/15/2018, 6:44 PM

## 2018-07-15 NOTE — Addendum Note (Signed)
Addended by: Jill Side on: 07/15/2018 03:18 PM   Modules accepted: Orders

## 2018-07-16 ENCOUNTER — Other Ambulatory Visit: Payer: Self-pay

## 2018-07-16 ENCOUNTER — Inpatient Hospital Stay (HOSPITAL_COMMUNITY): Payer: Medicaid Other | Admitting: Anesthesiology

## 2018-07-16 LAB — RPR: RPR: NONREACTIVE

## 2018-07-16 MED ORDER — OXYTOCIN 40 UNITS IN LACTATED RINGERS INFUSION - SIMPLE MED
1.0000 m[IU]/min | INTRAVENOUS | Status: DC
  Administered 2018-07-16: 1 m[IU]/min via INTRAVENOUS
  Filled 2018-07-16: qty 1000

## 2018-07-16 MED ORDER — OXYTOCIN 40 UNITS IN LACTATED RINGERS INFUSION - SIMPLE MED
1.0000 m[IU]/min | INTRAVENOUS | Status: DC
  Administered 2018-07-16 (×2): 1 m[IU]/min via INTRAVENOUS
  Administered 2018-07-16: 2 m[IU]/min via INTRAVENOUS
  Administered 2018-07-16: 4 m[IU]/min via INTRAVENOUS
  Administered 2018-07-16: 2 m[IU]/min via INTRAVENOUS

## 2018-07-16 MED ORDER — LACTATED RINGERS AMNIOINFUSION
INTRAVENOUS | Status: DC
  Administered 2018-07-16: 250 mL via INTRAUTERINE

## 2018-07-16 MED ORDER — EPHEDRINE 5 MG/ML INJ: 10.0000 mg | INTRAVENOUS | Status: DC | PRN

## 2018-07-16 MED ORDER — HYDRALAZINE HCL 20 MG/ML IJ SOLN
INTRAMUSCULAR | Status: AC
  Filled 2018-07-16: qty 1

## 2018-07-16 MED ORDER — LIDOCAINE HCL (PF) 1 % IJ SOLN
INTRAMUSCULAR | Status: DC | PRN
  Administered 2018-07-16: 10 mL via EPIDURAL

## 2018-07-16 MED ORDER — LACTATED RINGERS IV SOLN
500.0000 mL | Freq: Once | INTRAVENOUS | Status: AC
  Administered 2018-07-16: 500 mL via INTRAVENOUS

## 2018-07-16 MED ORDER — PHENYLEPHRINE 40 MCG/ML (10ML) SYRINGE FOR IV PUSH (FOR BLOOD PRESSURE SUPPORT): 80.0000 ug | PREFILLED_SYRINGE | INTRAVENOUS | Status: DC | PRN

## 2018-07-16 MED ORDER — FENTANYL 2.5 MCG/ML BUPIVACAINE 1/10 % EPIDURAL INFUSION (WH - ANES)
14.0000 mL/h | INTRAMUSCULAR | Status: DC | PRN
  Administered 2018-07-16: 14 mL/h via EPIDURAL
  Filled 2018-07-16: qty 100

## 2018-07-16 MED ORDER — TERBUTALINE SULFATE 1 MG/ML IJ SOLN
INTRAMUSCULAR | Status: AC
  Filled 2018-07-16: qty 1

## 2018-07-16 MED ORDER — TERBUTALINE SULFATE 1 MG/ML IJ SOLN
INTRAMUSCULAR | Status: AC
  Administered 2018-07-16: 0.25 mg
  Filled 2018-07-16: qty 1

## 2018-07-16 MED ORDER — FENTANYL 2.5 MCG/ML BUPIVACAINE 1/10 % EPIDURAL INFUSION (WH - ANES)
INTRAMUSCULAR | Status: AC
  Administered 2018-07-16: 300 ug
  Filled 2018-07-16: qty 100

## 2018-07-16 MED ORDER — DIPHENHYDRAMINE HCL 50 MG/ML IJ SOLN: 12.5000 mg | INTRAMUSCULAR | Status: DC | PRN

## 2018-07-16 NOTE — Anesthesia Preprocedure Evaluation (Addendum)
Anesthesia Evaluation  Patient identified by MRN, date of birth, ID band Patient awake    Reviewed: Allergy & Precautions, H&P , NPO status , Patient's Chart, lab work & pertinent test results  History of Anesthesia Complications Negative for: history of anesthetic complications  Airway Mallampati: III  TM Distance: >3 FB Neck ROM: full    Dental no notable dental hx.    Pulmonary neg pulmonary ROS,    Pulmonary exam normal        Cardiovascular negative cardio ROS Normal cardiovascular exam Rhythm:regular Rate:Normal     Neuro/Psych negative neurological ROS  negative psych ROS   GI/Hepatic negative GI ROS, Neg liver ROS,   Endo/Other  negative endocrine ROS  Renal/GU negative Renal ROS  negative genitourinary   Musculoskeletal   Abdominal   Peds  Hematology negative hematology ROS (+)   Anesthesia Other Findings   Reproductive/Obstetrics (+) Pregnancy                            Anesthesia Physical Anesthesia Plan  ASA: II  Anesthesia Plan: Epidural   Post-op Pain Management:    Induction:   PONV Risk Score and Plan:   Airway Management Planned:   Additional Equipment:   Intra-op Plan:   Post-operative Plan:   Informed Consent: I have reviewed the patients History and Physical, chart, labs and discussed the procedure including the risks, benefits and alternatives for the proposed anesthesia with the patient or authorized representative who has indicated his/her understanding and acceptance.     Plan Discussed with: CRNA and Surgeon  Anesthesia Plan Comments: (For C/S with labor epidural.)       Anesthesia Quick Evaluation

## 2018-07-16 NOTE — Progress Notes (Signed)
OB/[facility]N Faculty Practice: Labor Progress Note  Subjective: Much more comfortable now that has epidural. Cannot feel contractions. Received call from RN that decels when started pitocin so stopped.   Objective: BP (!) 118/59   Pulse 65   Temp 98.1 F (36.7 C) (Oral)   Resp 18   Ht 5\' 3"  (1.6 m)   Wt 94.3 kg   LMP 10/04/2017 (Exact Date)   SpO2 100%   BMI 36.85 kg/m  Gen: oxygen in place, lying on back   Dilation: 4.5 Effacement (%): 50 Cervical Position: Posterior Station: (High 3) Presentation: Vertex Exam by:: V  Assessment and Plan: 27 y.o. G1P0000 [redacted]w[redacted]d here for IOL for fetal bradycardia in clinic during NST for post-dates.   Labor: Induction started yesterday afternoon with one dose of cytotec. Decels initially improved with IUPC but then additional decels with starting pitocin. Discussed with patient that currently no indication for C/S as not yet in labor, hasn't failed pitocin trial yet. Will restart pitocin.  -- pain control: epidural  -- PPH Risk: low  Fetal Well-Being: EFW 7lbs by Leoplds. Cephalic by sutures. On last SVE, baby is direct OP.  -- Category II - continuous fetal monitoring - position changes, bolus(s), oxygen -- GBS negative    Takeya Marquis S. Earlene Plater, DO OB/GYN Fellow, Faculty Practice  9:51 PM

## 2018-07-16 NOTE — Progress Notes (Addendum)
OB/GYN Faculty Practice: Labor Progress Note  Subjective: Patient with occasional nausea and vomiting. Discomfort with checks.   Objective: BP (!) 102/57 (BP Location: Left Arm)   Pulse 85   Temp 98.6 F (37 C) (Oral)   Resp 18   Ht 5\' 3"  (1.6 m)   Wt 94.3 kg   LMP 10/04/2017 (Exact Date)   SpO2 98%   BMI 36.85 kg/m  Gen: lying on right side  Dilation: 4 Effacement (%): Thick Cervical Position: Posterior Station: -3 Presentation: Vertex Exam by:: jaton burgess  Assessment and Plan: 27 y.o. G1P0000 [redacted]w[redacted]d here for IOL for fetal bradycardia in clinic during NST for post-dates.   Labor: Induction started yesterday afternoon with one dose of cytotec. Continues to have recurrent, deep decelerations even off of pitocin. AROM for clear fluid and IUPC placed. Will start amnioinfusion.  -- pain control: fentanyl, open to epidural -- PPH Risk: low  Fetal Well-Being: EFW 7lbs by Leoplds. Cephalic by sutures. On last SVE, baby is direct OP.  -- Category II - continuous fetal monitoring - position changes, bolus(s), oxygen, pitocin off  -- GBS negative    Zoe Stecher S. Earlene Plater, DO OB/GYN Fellow, Faculty Practice  6:19 PM

## 2018-07-16 NOTE — Progress Notes (Signed)
LABOR PROGRESS NOTE  Zoe Keller is a 27 y.o. G1P0000 at [redacted]w[redacted]d  admitted for IOL due to fetal decel during NST in office   Subjective: Patient in good spirits. Attempted FB placement by both me and Dr. Earlene Plater without success.   Objective: BP (!) 102/57   Pulse 79   Temp 98.6 F (37 C) (Axillary)   Resp 16   Ht 5\' 3"  (1.6 m)   Wt 94.3 kg   LMP 10/04/2017 (Exact Date)   SpO2 98%   BMI 36.85 kg/m  or  Vitals:   07/16/18 0000 07/16/18 0101 07/16/18 0200 07/16/18 0300  BP: 114/68 100/62 (!) 103/46 (!) 102/57  Pulse: (!) 115 (!) 106 93 79  Resp: 16     Temp: 98.6 F (37 C)     TempSrc: Axillary     SpO2:   98%   Weight:      Height:        Dilation: 1 Effacement (%): 40 Cervical Position: Posterior Station: -3 Presentation: Vertex Exam by:: Dr. Earlene Plater FHT: baseline rate 150, moderate varibility, +acel, + variable decel Toco: q1-2 min  Labs: Lab Results  Component Value Date   WBC 13.8 (H) 07/15/2018   HGB 12.9 07/15/2018   HCT 38.0 07/15/2018   MCV 89.8 07/15/2018   PLT 417 (H) 07/15/2018    Patient Active Problem List   Diagnosis Date Noted  . Post-dates pregnancy 07/15/2018  . Excessive weight gain during pregnancy in second trimester 02/01/2018  . Low grade squamous intraepithelial lesion (LGSIL) on cervical Pap smear 01/08/2018  . Supervision of normal first pregnancy, antepartum 01/01/2018  . Obesity (BMI 30-39.9) 05/01/2016    Assessment / Plan: 27 y.o. G1P0000 at [redacted]w[redacted]d here for IOL for fetal decel   Labor: will give terb and allow rest. Unable to place FB Fetal Wellbeing:  Cat 2. Continued variable decel,. Pain Control:  Planning on epidural  Anticipated MOD:  NSVD  Oralia Manis, DO PGY-2 07/16/2018, 4:46 AM

## 2018-07-16 NOTE — Progress Notes (Signed)
OB/[facility]N Faculty Practice: Labor Progress Note  Subjective: Painful contractions. Lots of repositioning, fluid bolus(s), oxynge in place for recurrent deep variable decelerations.   Objective: BP 119/63   Pulse 73   Temp 97.8 F (36.6 C) (Oral)   Resp 19   Ht 5\' 3"  (1.6 m)   Wt 94.3 kg   LMP 10/04/2017 (Exact Date)   SpO2 98%   BMI 36.85 kg/m  Gen: lying on right side  Dilation: 1 Effacement (%): 40 Cervical Position: Posterior Station: -3 Presentation: Vertex Exam by:: Dr. Earlene Plater  Assessment and Plan: 27 y.o. G1P0000 [redacted]w[redacted]d here for IOL for fetal bradycardia in clinic during NST for post-dates.   Labor: Induction started yesterday afternoon with one dose of cytotec. Recurrent, spontaneous decelerations overnight (mostly deep variables) preventing any further induction. Trialed pitocin but worsening decelerations. FB still in place. Will stop pitocin and await FB out then try to AROM and give amnioinfusion.  -- FB placed -- pain control: fentanyl, open to epidural -- PPH Risk: low  Fetal Well-Being: EFW 7lbs by Leoplds. Cephalic by sutures.  -- Category II - continuous fetal monitoring - position changes, bolus(s), oxygen, pitocin off  -- GBS negative    Kay Shippy S. Earlene Plater, DO OB/GYN Fellow, Faculty Practice  2:41 PM

## 2018-07-16 NOTE — Progress Notes (Signed)
Called to room because of recurrent decelerations. Bolus going, position changes initiated. Pitocin stopped on arrival to room. Cervix rechecked and has changed to 5.5/80/-3, IUPC still in place and draining fluid. FSE placed. Strip continues to have good variability and response to scalp stim. Will plan to restart pitocin at 2 given continued change.

## 2018-07-16 NOTE — Anesthesia Procedure Notes (Signed)
Epidural Patient location during procedure: OB Start time: 07/16/2018 7:34 PM End time: 07/16/2018 7:51 PM  Staffing Anesthesiologist: Lucretia Kern, MD Performed: anesthesiologist   Preanesthetic Checklist Completed: patient identified, pre-op evaluation, timeout performed, IV checked, risks and benefits discussed and monitors and equipment checked  Epidural Patient position: sitting Prep: DuraPrep Patient monitoring: heart rate, continuous pulse ox and blood pressure Approach: midline Location: L3-L4 Injection technique: LOR saline  Needle:  Needle type: Tuohy  Needle gauge: 17 G Needle length: 9 cm Needle insertion depth: 6 cm Catheter type: closed end flexible Catheter size: 19 Gauge Catheter at skin depth: 11 cm  Assessment Events: blood not aspirated, injection not painful, no injection resistance, negative IV test and no paresthesia  Additional Notes Reason for block:procedure for pain

## 2018-07-16 NOTE — Progress Notes (Signed)
Reviewed strip with RN. Patient with deep variables after starting pitocin. Pitocin turned off, repositioned, given bolus and oxygen placed. Continues to have accelerations and great variability. Will plan to restart pitocin.

## 2018-07-16 NOTE — Progress Notes (Signed)
OB/GYN Faculty Practice: Labor Progress Note  Subjective: Doing well. Here with mother. States eventful night trying to find position baby liked. Open to trying FB again if we can use pain medication.   Objective: BP (!) 113/59   Pulse 87   Temp (!) 97.5 F (36.4 C) (Oral)   Resp 16   Ht 5\' 3"  (1.6 m)   Wt 94.3 kg   LMP 10/04/2017 (Exact Date)   SpO2 98%   BMI 36.85 kg/m  Gen: well-appearing, NAD Dilation: 1 Effacement (%): 40 Cervical Position: Posterior Station: -3 Presentation: Vertex Exam by:: Dr. Earlene Plater  Assessment and Plan: 27 y.o. G1P0000 [redacted]w[redacted]d here for IOL for fetal bradycardia in clinic during NST for post-dates.   Labor: Induction started yesterday afternoon with one dose of cytotec. Recurrent, spontaneous decelerations overnight (mostly deep variables) preventing any further induction.  -- FB placed -- will plan to start low-dose pitocin (max of 10) until FB out  -- pain control: fentanyl, open to epidural -- PPH Risk: low  Fetal Well-Being: EFW 7lbs by Leoplds. Cephalic by sutures.  -- Category II - continuous fetal monitoring - trying position changes, decelerations usually resolve on own -- GBS negative    Laurel S. Earlene Plater, DO OB/GYN Fellow, Faculty Practice  10:51 AM

## 2018-07-16 NOTE — Progress Notes (Signed)
LABOR PROGRESS NOTE  Zoe Keller is a 27 y.o. G1P0000 at [redacted]w[redacted]d  admitted for IOL due to fetal decel during NST in office   Subjective: Called to room due to recurrent decels. Continues to decel despite positional change.    Objective: BP 100/62   Pulse (!) 106   Temp 98.6 F (37 C) (Axillary)   Resp 16   Ht 5\' 3"  (1.6 m)   Wt 94.3 kg   LMP 10/04/2017 (Exact Date)   SpO2 98%   BMI 36.85 kg/m  or  Vitals:   07/15/18 2200 07/15/18 2301 07/16/18 0000 07/16/18 0101  BP: (!) 102/58 (!) 102/49 114/68 100/62  Pulse: 92 90 (!) 115 (!) 106  Resp:   16   Temp:   98.6 F (37 C)   TempSrc:   Axillary   SpO2:  98%    Weight:      Height:        Dilation: 1.5 Effacement (%): 40 Station: -3 Presentation: Vertex Exam by:: Lanice Shirts CNM FHT: baseline rate 140, moderate varibility, +acel, + variable and late decel Toco: irregular   Labs: Lab Results  Component Value Date   WBC 13.8 (H) 07/15/2018   HGB 12.9 07/15/2018   HCT 38.0 07/15/2018   MCV 89.8 07/15/2018   PLT 417 (H) 07/15/2018    Patient Active Problem List   Diagnosis Date Noted  . Post-dates pregnancy 07/15/2018  . Excessive weight gain during pregnancy in second trimester 02/01/2018  . Low grade squamous intraepithelial lesion (LGSIL) on cervical Pap smear 01/08/2018  . Supervision of normal first pregnancy, antepartum 01/01/2018  . Obesity (BMI 30-39.9) 05/01/2016    Assessment / Plan: 27 y.o. G1P0000 at [redacted]w[redacted]d here for IOL for fetal decel   Labor: s/p cytotec and terb. Fetal Wellbeing:  Cat 2. Continued variable decel, will attempt to give fluid bolus + O2. Dr. Earlene Plater made aware.  Pain Control:  Planning on epidural  Anticipated MOD:  NSVD  Oralia Manis, DO PGY-2 07/16/2018, 1:06 AM

## 2018-07-16 NOTE — Progress Notes (Signed)
Patient ID: Zoe Keller, female   DOB: 02-28-1991, 27 y.o.   MRN: [id number]   Prolonged variable with foley balloon insertion, although returned to baseline with accelerations. Although FHT currently reassuring, I discussed potential for cesarean section with patient, including potential risks:  Bleeding which may require transfusion or reoperation; infection which may require antibiotics; injury to bowel, bladder, ureters or other surrounding organs; injury to the fetus; need for additional procedures including hysterectomy in the event of a life-threatening hemorrhage; placental abnormalities wth subsequent pregnancies, incisional problems, thromboembolic phenomenon and other postoperative/anesthesia complications.   I discussed indications for cesarean delivery, including prolonged deceleration without return to baseline.  Levie Heritage, DO 07/16/2018 11:01 AM

## 2018-07-17 ENCOUNTER — Encounter (HOSPITAL_COMMUNITY)
Admission: AD | Disposition: A | Discharge status: Home / Self Care | Payer: Self-pay | Source: Home / Self Care | Attending: Obstetrics and Gynecology

## 2018-07-17 ENCOUNTER — Encounter (HOSPITAL_COMMUNITY): Payer: Self-pay | Admitting: Obstetrics and Gynecology

## 2018-07-17 DIAGNOSIS — Z3A40 40 weeks gestation of pregnancy: Secondary | ICD-10-CM

## 2018-07-17 SURGERY — Surgical Case
Anesthesia: Epidural | Site: Abdomen | Wound class: Clean Contaminated

## 2018-07-17 MED ORDER — MORPHINE SULFATE (PF) 0.5 MG/ML IJ SOLN
INTRAMUSCULAR | Status: AC
  Filled 2018-07-17: qty 10

## 2018-07-17 MED ORDER — NALBUPHINE HCL 10 MG/ML IJ SOLN: 5.0000 mg | INTRAMUSCULAR | Status: DC | PRN

## 2018-07-17 MED ORDER — MEPERIDINE HCL 25 MG/ML IJ SOLN
INTRAMUSCULAR | Status: AC
  Filled 2018-07-17: qty 1

## 2018-07-17 MED ORDER — OXYCODONE HCL 5 MG PO TABS
5.0000 mg | ORAL_TABLET | ORAL | Status: DC | PRN
  Administered 2018-07-19: 5 mg via ORAL
  Filled 2018-07-17: qty 1

## 2018-07-17 MED ORDER — DIBUCAINE 1 % RE OINT: 1.0000 "application " | TOPICAL_OINTMENT | RECTAL | Status: DC | PRN

## 2018-07-17 MED ORDER — OXYTOCIN 10 UNIT/ML IJ SOLN
INTRAVENOUS | Status: DC | PRN
  Administered 2018-07-17: 40 [IU] via INTRAVENOUS

## 2018-07-17 MED ORDER — DEXAMETHASONE SODIUM PHOSPHATE 10 MG/ML IJ SOLN
INTRAMUSCULAR | Status: AC
  Filled 2018-07-17: qty 1

## 2018-07-17 MED ORDER — OXYTOCIN 40 UNITS IN LACTATED RINGERS INFUSION - SIMPLE MED: 2.5000 [IU]/h | INTRAVENOUS | Status: AC

## 2018-07-17 MED ORDER — SCOPOLAMINE 1 MG/3DAYS TD PT72
MEDICATED_PATCH | TRANSDERMAL | Status: AC
  Filled 2018-07-17: qty 1

## 2018-07-17 MED ORDER — MEPERIDINE HCL 25 MG/ML IJ SOLN: 6.2500 mg | INTRAMUSCULAR | Status: DC | PRN

## 2018-07-17 MED ORDER — ONDANSETRON HCL 4 MG/2ML IJ SOLN
INTRAMUSCULAR | Status: DC | PRN
  Administered 2018-07-17: 4 mg via INTRAVENOUS

## 2018-07-17 MED ORDER — HYDROMORPHONE HCL 1 MG/ML IJ SOLN: 0.2500 mg | INTRAMUSCULAR | Status: DC | PRN

## 2018-07-17 MED ORDER — MEASLES, MUMPS & RUBELLA VAC IJ SOLR
0.5000 mL | Freq: Once | INTRAMUSCULAR | Status: DC
  Filled 2018-07-17: qty 0.5

## 2018-07-17 MED ORDER — OXYTOCIN 10 UNIT/ML IJ SOLN
INTRAMUSCULAR | Status: AC
  Filled 2018-07-17: qty 4

## 2018-07-17 MED ORDER — SIMETHICONE 80 MG PO CHEW
80.0000 mg | CHEWABLE_TABLET | ORAL | Status: DC
  Administered 2018-07-17 – 2018-07-20 (×3): 80 mg via ORAL
  Filled 2018-07-17 (×3): qty 1

## 2018-07-17 MED ORDER — ONDANSETRON HCL 4 MG/2ML IJ SOLN
INTRAMUSCULAR | Status: AC
  Filled 2018-07-17: qty 2

## 2018-07-17 MED ORDER — KETOROLAC TROMETHAMINE 30 MG/ML IJ SOLN
30.0000 mg | Freq: Four times a day (QID) | INTRAMUSCULAR | Status: AC | PRN
  Administered 2018-07-17: 30 mg via INTRAMUSCULAR

## 2018-07-17 MED ORDER — SODIUM BICARBONATE 8.4 % IV SOLN
INTRAVENOUS | Status: DC | PRN
  Administered 2018-07-17 (×2): 5 mL via EPIDURAL

## 2018-07-17 MED ORDER — SODIUM CHLORIDE 0.9 % IR SOLN
Status: DC | PRN
  Administered 2018-07-17: 1

## 2018-07-17 MED ORDER — KETOROLAC TROMETHAMINE 30 MG/ML IJ SOLN: 30.0000 mg | Freq: Once | INTRAMUSCULAR | Status: DC | PRN

## 2018-07-17 MED ORDER — ENOXAPARIN SODIUM 60 MG/0.6ML ~~LOC~~ SOLN
50.0000 mg | SUBCUTANEOUS | Status: DC
  Administered 2018-07-18 – 2018-07-20 (×3): 50 mg via SUBCUTANEOUS
  Filled 2018-07-17 (×3): qty 0.6

## 2018-07-17 MED ORDER — WITCH HAZEL-GLYCERIN EX PADS: 1.0000 "application " | MEDICATED_PAD | CUTANEOUS | Status: DC | PRN

## 2018-07-17 MED ORDER — PRENATAL MULTIVITAMIN CH
1.0000 | ORAL_TABLET | Freq: Every day | ORAL | Status: DC
  Administered 2018-07-18 – 2018-07-19 (×2): 1 via ORAL
  Filled 2018-07-17 (×2): qty 1

## 2018-07-17 MED ORDER — CEFAZOLIN SODIUM-DEXTROSE 2-4 GM/100ML-% IV SOLN
2.0000 g | INTRAVENOUS | Status: AC
  Administered 2018-07-17: 2 g via INTRAVENOUS
  Filled 2018-07-17: qty 100

## 2018-07-17 MED ORDER — COCONUT OIL OIL
1.0000 "application " | TOPICAL_OIL | Status: DC | PRN
  Administered 2018-07-19: 1 via TOPICAL
  Filled 2018-07-17: qty 120

## 2018-07-17 MED ORDER — SCOPOLAMINE 1 MG/3DAYS TD PT72
MEDICATED_PATCH | TRANSDERMAL | Status: DC | PRN
  Administered 2018-07-17: 1 via TRANSDERMAL

## 2018-07-17 MED ORDER — DIPHENHYDRAMINE HCL 50 MG/ML IJ SOLN: 12.5000 mg | INTRAMUSCULAR | Status: DC | PRN

## 2018-07-17 MED ORDER — LIDOCAINE-EPINEPHRINE (PF) 2 %-1:200000 IJ SOLN
INTRAMUSCULAR | Status: AC
  Filled 2018-07-17: qty 20

## 2018-07-17 MED ORDER — MEPERIDINE HCL 25 MG/ML IJ SOLN
INTRAMUSCULAR | Status: DC | PRN
  Administered 2018-07-17 (×2): 12.5 mg via INTRAVENOUS

## 2018-07-17 MED ORDER — KETOROLAC TROMETHAMINE 30 MG/ML IJ SOLN: 30.0000 mg | Freq: Four times a day (QID) | INTRAMUSCULAR | Status: AC | PRN

## 2018-07-17 MED ORDER — KETOROLAC TROMETHAMINE 30 MG/ML IJ SOLN
INTRAMUSCULAR | Status: AC
  Filled 2018-07-17: qty 1

## 2018-07-17 MED ORDER — PHENYLEPHRINE HCL 10 MG/ML IJ SOLN
INTRAMUSCULAR | Status: DC | PRN
  Administered 2018-07-17: 80 ug via INTRAVENOUS

## 2018-07-17 MED ORDER — ACETAMINOPHEN 500 MG PO TABS
1000.0000 mg | ORAL_TABLET | Freq: Four times a day (QID) | ORAL | Status: AC
  Administered 2018-07-17 – 2018-07-18 (×4): 1000 mg via ORAL
  Filled 2018-07-17 (×4): qty 2

## 2018-07-17 MED ORDER — MORPHINE SULFATE (PF) 0.5 MG/ML IJ SOLN
INTRAMUSCULAR | Status: DC | PRN
  Administered 2018-07-17: 3 mg via EPIDURAL

## 2018-07-17 MED ORDER — DIPHENHYDRAMINE HCL 25 MG PO CAPS: 25.0000 mg | ORAL_CAPSULE | Freq: Four times a day (QID) | ORAL | Status: DC | PRN

## 2018-07-17 MED ORDER — NALBUPHINE HCL 10 MG/ML IJ SOLN: 5.0000 mg | Freq: Once | INTRAMUSCULAR | Status: DC | PRN

## 2018-07-17 MED ORDER — SODIUM CHLORIDE 0.9 % IV SOLN
500.0000 mg | INTRAVENOUS | Status: DC
  Administered 2018-07-17: 500 mg via INTRAVENOUS
  Filled 2018-07-17: qty 500

## 2018-07-17 MED ORDER — SIMETHICONE 80 MG PO CHEW
80.0000 mg | CHEWABLE_TABLET | Freq: Three times a day (TID) | ORAL | Status: DC
  Administered 2018-07-17 – 2018-07-20 (×3): 80 mg via ORAL
  Filled 2018-07-17 (×4): qty 1

## 2018-07-17 MED ORDER — NALOXONE HCL 4 MG/10ML IJ SOLN
1.0000 ug/kg/h | INTRAVENOUS | Status: DC | PRN
  Filled 2018-07-17: qty 5

## 2018-07-17 MED ORDER — TETANUS-DIPHTH-ACELL PERTUSSIS 5-2.5-18.5 LF-MCG/0.5 IM SUSP: 0.5000 mL | Freq: Once | INTRAMUSCULAR | Status: DC

## 2018-07-17 MED ORDER — MENTHOL 3 MG MT LOZG: 1.0000 | LOZENGE | OROMUCOSAL | Status: DC | PRN

## 2018-07-17 MED ORDER — PROMETHAZINE HCL 25 MG/ML IJ SOLN: 6.2500 mg | INTRAMUSCULAR | Status: DC | PRN

## 2018-07-17 MED ORDER — ACETAMINOPHEN 325 MG PO TABS: 650.0000 mg | ORAL_TABLET | ORAL | Status: DC | PRN

## 2018-07-17 MED ORDER — ONDANSETRON HCL 4 MG/2ML IJ SOLN: 4.0000 mg | Freq: Three times a day (TID) | INTRAMUSCULAR | Status: DC | PRN

## 2018-07-17 MED ORDER — LACTATED RINGERS IV SOLN
INTRAVENOUS | Status: DC
  Administered 2018-07-17 (×2): via INTRAVENOUS

## 2018-07-17 MED ORDER — DIPHENHYDRAMINE HCL 25 MG PO CAPS
25.0000 mg | ORAL_CAPSULE | ORAL | Status: DC | PRN
  Filled 2018-07-17: qty 1

## 2018-07-17 MED ORDER — PHENYLEPHRINE 40 MCG/ML (10ML) SYRINGE FOR IV PUSH (FOR BLOOD PRESSURE SUPPORT)
PREFILLED_SYRINGE | INTRAVENOUS | Status: AC
  Filled 2018-07-17: qty 10

## 2018-07-17 MED ORDER — SIMETHICONE 80 MG PO CHEW: 80.0000 mg | CHEWABLE_TABLET | ORAL | Status: DC | PRN

## 2018-07-17 MED ORDER — NALOXONE HCL 0.4 MG/ML IJ SOLN: 0.4000 mg | INTRAMUSCULAR | Status: DC | PRN

## 2018-07-17 MED ORDER — SENNOSIDES-DOCUSATE SODIUM 8.6-50 MG PO TABS
2.0000 | ORAL_TABLET | ORAL | Status: DC
  Administered 2018-07-17 – 2018-07-20 (×3): 2 via ORAL
  Filled 2018-07-17 (×3): qty 2

## 2018-07-17 MED ORDER — SODIUM BICARBONATE 8.4 % IV SOLN
INTRAVENOUS | Status: AC
  Filled 2018-07-17: qty 50

## 2018-07-17 MED ORDER — LACTATED RINGERS IV SOLN
INTRAVENOUS | Status: DC | PRN
  Administered 2018-07-17: 10:00:00 via INTRAVENOUS

## 2018-07-17 MED ORDER — ZOLPIDEM TARTRATE 5 MG PO TABS: 5.0000 mg | ORAL_TABLET | Freq: Every evening | ORAL | Status: DC | PRN

## 2018-07-17 MED ORDER — SCOPOLAMINE 1 MG/3DAYS TD PT72: 1.0000 | MEDICATED_PATCH | Freq: Once | TRANSDERMAL | Status: DC

## 2018-07-17 MED ORDER — SODIUM CHLORIDE 0.9% FLUSH: 3.0000 mL | INTRAVENOUS | Status: DC | PRN

## 2018-07-17 MED ORDER — OXYCODONE HCL 5 MG PO TABS: 10.0000 mg | ORAL_TABLET | ORAL | Status: DC | PRN

## 2018-07-17 MED ORDER — IBUPROFEN 600 MG PO TABS
600.0000 mg | ORAL_TABLET | Freq: Four times a day (QID) | ORAL | Status: DC
  Administered 2018-07-17 – 2018-07-20 (×11): 600 mg via ORAL
  Filled 2018-07-17 (×11): qty 1

## 2018-07-17 SURGICAL SUPPLY — 36 items
BENZOIN TINCTURE PRP APPL 2/3 (GAUZE/BANDAGES/DRESSINGS) ×3 IMPLANT
CHLORAPREP W/TINT 26ML (MISCELLANEOUS) ×3 IMPLANT
CLAMP CORD UMBIL (MISCELLANEOUS) IMPLANT
CLOSURE STERI STRIP 1/2 X4 (GAUZE/BANDAGES/DRESSINGS) ×2 IMPLANT
CLOSURE WOUND 1/2 X4 (GAUZE/BANDAGES/DRESSINGS) ×1
CLOTH BEACON ORANGE TIMEOUT ST (SAFETY) ×3 IMPLANT
DRSG OPSITE POSTOP 4X10 (GAUZE/BANDAGES/DRESSINGS) ×3 IMPLANT
ELECT REM PT RETURN 9FT ADLT (ELECTROSURGICAL) ×3
ELECTRODE REM PT RTRN 9FT ADLT (ELECTROSURGICAL) ×1 IMPLANT
EXTRACTOR VACUUM M CUP 4 TUBE (SUCTIONS) IMPLANT
EXTRACTOR VACUUM M CUP 4' TUBE (SUCTIONS)
GLOVE BIOGEL PI IND STRL 7.0 (GLOVE) ×2 IMPLANT
GLOVE BIOGEL PI IND STRL 7.5 (GLOVE) ×2 IMPLANT
GLOVE BIOGEL PI INDICATOR 7.0 (GLOVE) ×4
GLOVE BIOGEL PI INDICATOR 7.5 (GLOVE) ×4
GLOVE ECLIPSE 7.5 STRL STRAW (GLOVE) ×3 IMPLANT
GOWN STRL REUS W/TWL LRG LVL3 (GOWN DISPOSABLE) ×9 IMPLANT
KIT ABG SYR 3ML LUER SLIP (SYRINGE) IMPLANT
NEEDLE HYPO 25X5/8 SAFETYGLIDE (NEEDLE) IMPLANT
NS IRRIG 1000ML POUR BTL (IV SOLUTION) ×3 IMPLANT
PACK C SECTION WH (CUSTOM PROCEDURE TRAY) ×3 IMPLANT
PAD OB MATERNITY 4.3X12.25 (PERSONAL CARE ITEMS) ×3 IMPLANT
PENCIL SMOKE EVAC W/HOLSTER (ELECTROSURGICAL) ×3 IMPLANT
RTRCTR C-SECT PINK 25CM LRG (MISCELLANEOUS) ×3 IMPLANT
SPONGE LAP 18X18 RF (DISPOSABLE) ×9 IMPLANT
STRIP CLOSURE SKIN 1/2X4 (GAUZE/BANDAGES/DRESSINGS) ×2 IMPLANT
SUT PLAIN 2 0 (SUTURE) ×4
SUT PLAIN ABS 2-0 CT1 27XMFL (SUTURE) ×2 IMPLANT
SUT VIC AB 0 CT1 36 (SUTURE) ×3 IMPLANT
SUT VIC AB 0 CTX 36 (SUTURE) ×4
SUT VIC AB 0 CTX36XBRD ANBCTRL (SUTURE) ×2 IMPLANT
SUT VIC AB 2-0 CT1 27 (SUTURE) ×2
SUT VIC AB 2-0 CT1 TAPERPNT 27 (SUTURE) ×1 IMPLANT
SUT VIC AB 4-0 KS 27 (SUTURE) ×3 IMPLANT
TOWEL OR 17X24 6PK STRL BLUE (TOWEL DISPOSABLE) ×3 IMPLANT
TRAY FOLEY W/BAG SLVR 14FR LF (SET/KITS/TRAYS/PACK) ×3 IMPLANT

## 2018-07-17 NOTE — Progress Notes (Signed)
POC for C/S initiated.  OR and Anesthesia notified

## 2018-07-17 NOTE — Progress Notes (Signed)
Evaluated patient. G1 at term, iol for fetal decelerations and new oligo. Induction now for going on 48 hours. Treated w/ cytotec, foley, arom, and pitocin. Have had to stop pitocin now multiple times for fetal indication (recurrent variable and late decelerations) despite resuscitative measures including amnioinfusion. Cervix 5/50/-3 and unchanged for several hours. Current category 2 strip for intermittent late decelerations, but overall reassuring with good variability and accelerations. After careful discussion of risks and benefits, shared decision to proceed w/ cesarean section for fetal indications.   The risks of cesarean section were discussed with the patient including but were not limited to: bleeding which may require transfusion or reoperation; infection which may require antibiotics; injury to bowel, bladder, ureters or other surrounding organs; injury to the fetus; need for additional procedures including hysterectomy in the event of a life-threatening hemorrhage; placental abnormalities wth subsequent pregnancies, incisional problems, thromboembolic phenomenon and other postoperative/anesthesia complications. Anesthesia and OR aware.  Preoperative prophylactic antibiotics (including azithromycin) and SCDs ordered on call to the OR.  To OR when ready.

## 2018-07-17 NOTE — Op Note (Signed)
Cesarean Section Operative Report  PATIENT: Zoe Keller  PROCEDURE DATE: 07/17/2018  PREOPERATIVE DIAGNOSES: Intrauterine pregnancy at [redacted]w[redacted]d weeks gestation; recurrent fetal decelerations and failure to progress  POSTOPERATIVE DIAGNOSES: The same  PROCEDURE: Primary Low Transverse Cesarean Section  SURGEON:   Surgeon(s) and Role:    * Wouk, Wilfred Curtis, MD - Primary    * Arvilla Market, DO - Assisting - OB Fellow   INDICATIONS: Zoe Keller is a 27 y.o. G1P0000 at [redacted]w[redacted]d here for cesarean section secondary to the indications listed under preoperative diagnoses; please see preoperative note for further details.  The risks of cesarean section were discussed with the patient including but were not limited to: bleeding which may require transfusion or reoperation; infection which may require antibiotics; injury to bowel, bladder, ureters or other surrounding organs; injury to the fetus; need for additional procedures including hysterectomy in the event of a life-threatening hemorrhage; placental abnormalities wth subsequent pregnancies, incisional problems, thromboembolic phenomenon and other postoperative/anesthesia complications.   The patient concurred with the proposed plan, giving informed written consent for the procedure.    FINDINGS:  Viable female infant in cephalic OP presentation with nuchal cord x2.  Apgars 7, 7 and 9.  Clear amniotic fluid.  Intact placenta, three vessel cord.  Normal uterus, fallopian tubes and ovaries bilaterally.  ANESTHESIA: Epidural INTRAVENOUS FLUIDS: 1000 mL  ESTIMATED BLOOD LOSS: 146 mL URINE OUTPUT:  350 ml SPECIMENS: Placenta sent to L&D COMPLICATIONS: None immediate  PROCEDURE IN DETAIL:  The patient preoperatively received intravenous antibiotics and had sequential compression devices applied to her lower extremities.  She was then taken to the operating room where the epidural anesthesia was dosed up to surgical level and was  found to be adequate. She was then placed in a dorsal supine position with a leftward tilt, and prepped and draped in a sterile manner.  A foley catheter was placed into her bladder and attached to constant gravity.    After an adequate timeout was performed, a Pfannenstiel skin incision was made with scalpel and carried through to the underlying layer of fascia. The fascia was incised in the midline, and this incision was extended bilaterally using the Mayo scissors.  Kocher clamps were applied to the superior aspect of the fascial incision and the underlying rectus muscles were dissected off bluntly.  A similar process was carried out on the inferior aspect of the fascial incision. The rectus muscles were separated in the midline bluntly and the peritoneum was entered bluntly. Attention was turned to the lower uterine segment where a low transverse hysterotomy was made with a scalpel and extended bilaterally bluntly.  The infant was successfully delivered, the cord was clamped and cut after one minute, and the infant was handed over to the awaiting neonatology team. Uterine massage was then administered, and the placenta delivered intact with a three-vessel cord. The uterus was then cleared of clots and debris.  The hysterotomy was closed with 0 Vicryl in a running locked fashion, and an imbricating layer was also placed with 0 Vicryl. The pelvis was cleared of all clot and debris. Hemostasis was confirmed on all surfaces.  The peritoneum was closed with a 0 Vicryl running stitch. The fascia was then closed using 0 Vicryl in a running fashion.  The subcutaneous layer was irrigated,then reapproximated with 2-0 plain gut running stitches.  The skin was closed with a 4-0 Vicryl subcuticular stitch.   The patient tolerated the procedure well. Sponge, lap, instrument and needle counts were correct  x 3.  She was taken to the recovery room in stable condition.   An experienced assistant was required given the  standard of surgical care given the complexity of the case.  This assistant was needed for exposure, dissection, suctioning, retraction, instrument exchange, assisting with delivery with administration of fundal pressure, and for overall help during the procedure.   Maternal Disposition: PACU - hemodynamically stable.   Infant Disposition: stable   Marcy Siren, D.O. OB Fellow  07/17/2018, 10:48 AM

## 2018-07-17 NOTE — Progress Notes (Signed)
OB/GYN Faculty Practice: Labor Progress Note  Subjective: In room because of continued repetitive decels. Patient on hands/knees on arrival in room.   Objective: BP 118/72   Pulse 61   Temp 98.3 F (36.8 C) (Oral)   Resp 18   Ht 5\' 3"  (1.6 m)   Wt 94.3 kg   LMP 10/04/2017 (Exact Date)   SpO2 100%   BMI 36.85 kg/m  Gen: well-appearing, NAD, hands and knees, tired  Dilation: 5.5 Effacement (%): 80 Cervical Position: Middle Station: -3 Presentation: Vertex Exam by:: Dr. Marlis Edelson  Assessment and Plan: 27 y.o. G1P0000 [redacted]w[redacted]d here for IOL for fetal bradycardia in clinic during NST for post-dates.   Labor: Induction started afternoon 07/15/18 with one dose of cytotec but unable to continue augmentation because of repetitive decels. Able to have FB placed, now out. Pitocin going and with adequate contractions for last 3-4 hours. No cervical change in last 3.5 hours.  -- pain control: epidural  -- PPH Risk: low  Fetal Well-Being: EFW 7lbs by Leoplds. Cephalic by sutures.  -- Category II - continuous fetal monitoring - position changes, bolus(s), oxygen -- GBS negative    Saleh Ulbrich S. Earlene Plater, DO OB/GYN Fellow, Faculty Practice  3:09 AM

## 2018-07-17 NOTE — Progress Notes (Signed)
OB/GYN Faculty Practice: Labor Progress Note  Subjective: Patient sleeping on arrival to room.   Objective: BP 113/71   Pulse 64   Temp 97.7 F (36.5 C) (Axillary)   Resp 18   Ht 5\' 3"  (1.6 m)   Wt 94.3 kg   LMP 10/04/2017 (Exact Date)   SpO2 100%   BMI 36.85 kg/m  Gen: tired, on back, oxygen in place  Dilation: 5.5 Effacement (%): 80 Cervical Position: Middle Station: -3 Presentation: Vertex Exam by:: Dr. Marlis Edelson  Assessment and Plan: 27 y.o. G1P0000 [redacted]w[redacted]d here for IOL for fetal bradycardia in clinic during NST for post-dates.   Labor: Induction started afternoon 07/15/18 with one dose of cytotec but unable to continue augmentation because of repetitive decels. Able to have FB placed next morning, now out. Pitocin going and with adequate contractions for last 3-4 hours but recently inadequate. Cervical exam unchanged. Will try to increase pitocin once strip improves and if fetal intolerance or no change, will plan for cesarean section.  -- continue amnioinfusion, continues to get good return  -- pain control: epidural  -- PPH Risk: low  Fetal Well-Being: EFW 7lbs by Leoplds. Cephalic by sutures.  -- Category II - continuous fetal monitoring - position changes, bolus(s), oxygen -- GBS negative    Keonia Pasko S. Earlene Plater, DO OB/GYN Fellow, Faculty Practice  6:44 AM

## 2018-07-17 NOTE — Transfer of Care (Signed)
Immediate Anesthesia Transfer of Care Note  Patient: Zoe Keller  Procedure(s) Performed: CESAREAN SECTION (N/A Abdomen)  Patient Location: PACU  Anesthesia Type:Epidural  Level of Consciousness: awake, alert , oriented and patient cooperative  Airway & Oxygen Therapy: Patient Spontanous Breathing  Post-op Assessment: Report given to RN and Post -op Vital signs reviewed and stable  Post vital signs: Reviewed and stable  Last Vitals:  Vitals Value Taken Time  BP    Temp    Pulse    Resp    SpO2      Last Pain:  Vitals:   07/17/18 0800  TempSrc: Oral  PainSc:       Patients Stated Pain Goal: 0 (07/16/18 1930)  Complications: No apparent anesthesia complications

## 2018-07-17 NOTE — Anesthesia Postprocedure Evaluation (Signed)
Anesthesia Post Note  Patient: Zoe Keller  Procedure(s) Performed: CESAREAN SECTION (N/A Abdomen)     Patient location during evaluation: Mother Baby Anesthesia Type: Epidural Level of consciousness: awake Pain management: satisfactory to patient Vital Signs Assessment: post-procedure vital signs reviewed and stable Respiratory status: spontaneous breathing Cardiovascular status: stable Anesthetic complications: no    Last Vitals:  Vitals:   07/17/18 1525 07/17/18 1625  BP: 103/60   Pulse:    Resp: 18   Temp: 36.4 C   SpO2: 98% 98%    Last Pain:  Vitals:   07/17/18 1525  TempSrc: Oral  PainSc:    Pain Goal: Patients Stated Pain Goal: 0 (07/16/18 1930)               Cephus Shelling

## 2018-07-18 ENCOUNTER — Inpatient Hospital Stay (HOSPITAL_COMMUNITY)
Admission: RE | Admit: 2018-07-18 | Payer: Medicaid Other | Source: Ambulatory Visit | Admitting: Obstetrics & Gynecology

## 2018-07-18 LAB — CBC
HCT: 30.3 % — ABNORMAL LOW (ref 36.0–46.0)
HEMOGLOBIN: 10.3 g/dL — AB (ref 12.0–15.0)
MCH: 30.3 pg (ref 26.0–34.0)
MCHC: 34 g/dL (ref 30.0–36.0)
MCV: 89.1 fL (ref 80.0–100.0)
NRBC: 0 % (ref 0.0–0.2)
PLATELETS: 333 10*3/uL (ref 150–400)
RBC: 3.4 MIL/uL — AB (ref 3.87–5.11)
RDW: 12.9 % (ref 11.5–15.5)
WBC: 20.7 10*3/uL — AB (ref 4.0–10.5)

## 2018-07-18 LAB — CREATININE, SERUM
CREATININE: 0.53 mg/dL (ref 0.44–1.00)
GFR calc non Af Amer: >60 mL/min (ref >60)

## 2018-07-18 LAB — BIRTH TISSUE RECOVERY COLLECTION (PLACENTA DONATION)

## 2018-07-18 NOTE — Lactation Note (Signed)
This note was copied from a baby's chart. Lactation Consultation Note Baby 60 hrs old. Mom has large pendulous breast w/flat nipples at the bottom of the breast. Not a candidate for NS. Rt. Breast w/ thick edematous breast tissue. A lot of reverse pressure needed to get breast compressible enough to hand express to relieve colostrum. Encouraged mom to wear bra today. Shells given as well as hand pump to pre-pump to evert the nipple for latching.  DEBP and kit taken into rm. Suggested to mom pumping. Will ask RN to set up. In football position latched baby to Lt. Breast w/compressible w/stimulation to evert compressible nipple.  Baby BF well. Reverse pressure and hand expression collected 2 ml colostrum from Rt. Breast. Breast tissue more compressible after working w/hand expressing.  Newborn behavior, feeding habits, I&O, STS, supply and demand discussed. Mom encouraged to feed baby 8-12 times/24 hours and with feeding cues.  Answered mom's questions.  [place] brochure given w/resources, support groups and [place] services. Mom has all ready made OP F/U appt. W/ LC at [facility].  Patient Name: Girl Roselin Wiemann [id number] Date: 07/18/2018 Reason for consult: Initial assessment;1st time breastfeeding   Maternal Data    Feeding Feeding Type: Breast Fed  LATCH Score Latch: Repeated attempts needed to sustain latch, nipple held in mouth throughout feeding, stimulation needed to elicit sucking reflex.  Audible Swallowing: A few with stimulation  Type of Nipple: Flat  Comfort (Breast/Nipple): Filling, red/small blisters or bruises, mild/mod discomfort(edema)  Hold (Positioning): Assistance needed to correctly position infant at breast and maintain latch.  LATCH Score: 5  Interventions Interventions: Breast feeding basics reviewed;Support pillows;Assisted with latch;Position options;Expressed milk;Skin to skin;Breast massage;Hand express;Shells;Pre-pump if needed;Reverse pressure;Hand  pump;Breast compression;DEBP;Adjust position  Lactation Tools Discussed/Used Tools: Shells;Pump Shell Type: Inverted Breast pump type: Double-Electric Breast Pump;Manual WIC Program: Yes Pump Review: Milk Storage Initiated by:: (milk storage) Date initiated:: 07/18/18   Consult Status Consult Status: Follow-up Date: 07/18/18 Follow-up type: In-patient    Theodoro Kalata 07/18/2018, 3:19 AM

## 2018-07-18 NOTE — Progress Notes (Signed)
POSTPARTUM PROGRESS NOTE  POD #1  Subjective:  Zoe Keller is a 27 y.o. G1P0000 s/p pLTCS at [redacted]w[redacted]d for NRFHT.  She reports she doing well. NAEON. Ambulating well with mild associated dizziness and pain at incision site. Was seen by lactation consultant overnight. No N/V, SOB, lightheadedness. Passing gas. No BMs. Voiding well. Pain is wellcontrolled.   Objective: Blood pressure (!) 97/54, pulse 75, temperature 98.3 F (36.8 C), temperature source Oral, resp. rate 18, height 5\' 3"  (1.6 m), weight 94.3 kg, last menstrual period 10/04/2017, SpO2 98 %.  Physical Exam:  General: alert, cooperative and no distress Chest: no respiratory distress Heart:regular rate, distal pulses intact Abdomen: soft, nontender,  Uterine Fundus: firm, appropriately tender DVT Evaluation: No calf swelling or tenderness Extremities: No LE edema Skin: warm, dry; incision clean/dry/intact w/ honeycomb dressing in place  Recent Labs    07/15/18 1645 07/18/18 0508  HGB 12.9 10.3*  HCT 38.0 30.3*    Assessment/Plan: Zoe Keller is a 27 y.o. G1P0000 s/p pLTCS at [redacted]w[redacted]d for NRFHT.  POD#1 - Doing welll; pain well controlled. H/H appropriate  Routine postpartum care  OOB, ambulated  Lovenox for VTE prophylaxis  Anemia: asymptomatic  -- Most recent Hgb 10.3 (07/28/18 vs 12.9 prior to c-section)  Contraception: IUD Feeding: Breast  Dispo: Plan for discharge tomorrow   LOS: 3 days   Raul Del, MS3 07/18/2018, 9:41 AM  I confirm that I have verified the information documented in the student's note and that I have also personally reperformed the physical exam and all medical decision making activities.   Luna Kitchens CNM

## 2018-07-19 NOTE — Lactation Note (Signed)
This note was copied from a baby's chart. Lactation Consultation Note  Patient Name: Zoe Keller [id number] Date: 07/19/2018 Reason for consult: Follow-up assessment;1st time breastfeeding;Primapara;Term;Infant weight loss;Nipple pain/trauma  Visited with P1 Mom of term baby at 83 hrs old.  Baby at 9.6% weight loss today.  Mom has been exclusively breastfeeding predominantly on her left breast.  Left breast has a more compressible areola and longer nipple shaft.    Baby had just fed, but was acting fussy.  Removed t shirt and placed baby STS in football hold.  Mom's position adjusted to more upright, placing a pillow vertically behind her back.   Assisted with latching baby on right breast.  Mom complained of pain, but it subsided after about a minute.  Took baby off after 5 mins, and noted the nipple was everted more and rounded in shape.  Hand expression revealed transitional milk flowing well.   Baby relatched easily onto right breast.  Mom denied any discomfort.  Mom shown how to use breast compression to increase milk transfer.    Talked about supplementing baby after breast feeding due to weight loss.  DEBP set up at bedside.  Reviewed plan and cleaning of pump parts reviewed with Mom and GMOB.    Feeding Plan- 1- Keep baby STS as much as possible 2- Latch baby to breast at least every 3 hrs, sooner if baby cueing. 3-Pump both breasts 15 mins on initiation setting, add breast massage and hand expression. 4- offer baby 20-25 ml of EBM+/formula by cup, syringe, paced bottle. 5- ask for help prn.   LATCH Score Latch: Grasps breast easily, tongue down, lips flanged, rhythmical sucking.  Audible Swallowing: Spontaneous and intermittent  Type of Nipple: Everted at rest and after stimulation(short nipple shaft on right breast)  Comfort (Breast/Nipple): Soft / non-tender  Hold (Positioning): Assistance needed to correctly position infant at breast and maintain latch.  LATCH  Score: 9  Interventions Interventions: Breast feeding basics reviewed;Assisted with latch;Skin to skin;Breast massage;Hand express;Pre-pump if needed;Breast compression;Adjust position;Support pillows;Position options;Expressed milk;Coconut oil;Hand pump;DEBP  Lactation Tools Discussed/Used Tools: Pump Breast pump type: Double-Electric Breast Pump WIC Program: Yes Pump Review: Setup, frequency, and cleaning;Milk Storage Initiated by:: Erby Pian RN IBCLC Date initiated:: 07/19/18   Consult Status Consult Status: Follow-up Date: 07/20/18 Follow-up type: In-patient    Judee Clara 07/19/2018, 11:36 AM
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
## 2018-07-20 DIAGNOSIS — O4100X0 Oligohydramnios, unspecified trimester, not applicable or unspecified: Secondary | ICD-10-CM | POA: Diagnosis present

## 2018-07-20 MED ORDER — OXYCODONE HCL 5 MG PO TABS: 5.0000 mg | ORAL_TABLET | Freq: Four times a day (QID) | ORAL | 0 refills | Status: AC | PRN

## 2018-07-20 MED ORDER — SENNOSIDES-DOCUSATE SODIUM 8.6-50 MG PO TABS: 2.0000 | ORAL_TABLET | ORAL | 0 refills | Status: DC

## 2018-07-20 MED ORDER — IBUPROFEN 600 MG PO TABS: 600.0000 mg | ORAL_TABLET | Freq: Four times a day (QID) | ORAL | 0 refills | Status: AC

## 2018-07-20 NOTE — Lactation Note (Signed)
This note was copied from a baby's chart. Lactation Consultation Note; Mom has just finished feeding baby formula from a cup. Baby is asleep in visitors arms. Mom reports nipples are sore. Nipples slightly flat and raw area noted on both nipples. Plans to get DEBP from [facility]. I showed her how to use pump pieces as manual double pump. Reports breasts are feeling a little fuller this morning and when she pumped this morning the milk looked white. Encouraged to page for assist when baby wakes for next feeding. Has comfort gels and coconut oil. Reviewed not to use both at the same time. No questions at present.   Patient Name: Zoe Keller [id number] Date: 07/20/2018 Reason for consult: Follow-up assessment   Maternal Data Has patient been taught Hand Expression?: Yes Does the patient have breastfeeding experience prior to this delivery?: No  Feeding Feeding Type: Formula  LATCH Score                   Interventions    Lactation Tools Discussed/Used WIC Program: Yes   Consult Status Consult Status: Follow-up Date: 07/20/18 Follow-up type: In-patient    Pamelia Hoit 07/20/2018, 8:50 AM

## 2018-07-20 NOTE — Discharge Summary (Signed)
Obstetrics Discharge Summary OB/GYN Faculty Practice   Patient Name: Zoe Keller DOB: 1991-06-29 MRN: [id number]  Date of admission: 07/15/2018 Delivering MD: Shonna Chock BEDFORD   Date of discharge: 07/20/2018  Admitting diagnosis: 41 wks, ctx Intrauterine pregnancy: [redacted]w[redacted]d     Secondary diagnosis:   Active Problems:   Post-dates pregnancy   Oligohydramnios  Additional problems:  . Obesity, BMI 37 . LGSIL, HPV(+)  . Excessive weight gain during pregnancy     Discharge diagnosis: Term pregnancy, delivered by Cesarean section for NRFHTs                                            Postpartum procedures: None  Complications: none  Hospital course: Juana Haralson is a 27 y.o. [redacted]w[redacted]d who was admitted for IOL for fetal bradycardia on term NST and new diagnosis of oligohydramnios . Her pregnancy was complicated by the above noted items. Her labor course was notable for 36+ hour induction including cytotec, foley bulb, pitocin, and AROM. Had epidural placed. Recurrent deep variables with some improvement after IUPC and amnioinfusion but persisted without interval cervical change despite adequate contractions.  Delivery was complicated by double nuchal cord. Please see delivery/op note for additional details. Her postpartum course was uncomplicated. She was breastfeeding and working with lactation, supplementing given infant weight loss. By day of discharge, she was passing flatus, urinating, eating and drinking without difficulty. Her pain was well-controlled, and she was discharged home with ibuprofen and oxycodone 5mg  #20 for severe pain. She will follow-up in clinic in 10-14 days for incision check and in 4-6 weeks for postpartum visit.   Physical exam  Vitals:   07/18/18 2227 07/19/18 0518 07/19/18 2320 07/20/18 0602  BP: 113/68 120/75 128/81 120/81  Pulse: 89 81 93 81  Resp: 18 20 20 20   Temp: 98.1 F (36.7 C) 97.9 F (36.6 C) 98.2 F (36.8 C) 98.3 F (36.8 C)  TempSrc: Oral  Oral Oral Oral  SpO2:    100%  Weight:      Height:       General: well-appearing, NAD Lochia: appropriate Uterine Fundus: firm Incision: Dressing is clean, dry, and intact DVT Evaluation: No significant calf/ankle edema. Labs: Lab Results  Component Value Date   WBC 20.7 (H) 07/18/2018   HGB 10.3 (L) 07/18/2018   HCT 30.3 (L) 07/18/2018   MCV 89.1 07/18/2018   PLT 333 07/18/2018   CMP Latest Ref Rng & Units 07/18/2018  Creatinine 0.44 - 1.00 mg/dL 4.09    Discharge instructions: Per After Visit Summary and "Baby and Me Booklet"  After visit meds:  Allergies as of 07/20/2018   No Known Allergies     Medication List    STOP taking these medications   ondansetron 4 MG disintegrating tablet Commonly known as:  ZOFRAN-ODT     TAKE these medications   ibuprofen 600 MG tablet Commonly known as:  ADVIL,MOTRIN Take 1 tablet (600 mg total) by mouth every 6 (six) hours.   multivitamin-prenatal 27-0.8 MG Tabs tablet Take 1 tablet by mouth daily at 12 noon.   oxyCODONE 5 MG immediate release tablet Commonly known as:  Oxy IR/ROXICODONE Take 1 tablet (5 mg total) by mouth every 6 (six) hours as needed for up to 5 days for severe pain.   senna-docusate 8.6-50 MG tablet Commonly known as:  Senokot-S Take 2 tablets by mouth daily. Start taking  on:  07/21/2018       Postpartum contraception: IUD Diet: Routine Diet Activity: Advance as tolerated. Pelvic rest for 6 weeks.   Follow-up Appt: Future Appointments  Date Time Provider Department Center  07/31/2018 10:00 AM [facility] NURSE [facility]  08/28/2018  2:35 PM Reva Bores, MD [facility]   Newborn Data: Live born female  Birth Weight: 6 lb 15.6 oz (3165 g) APGAR: 7, 7  Newborn Delivery   Birth date/time:  07/17/2018 09:55:00 Delivery type:  C-Section, Low Transverse Trial of labor:  Yes C-section categorization:  Primary    Baby Feeding: Breastfeeding, pumping and supplementing Disposition:home with  mother  Cristal Deer. Earlene Plater, DO OB/GYN Fellow, Faculty Practice

## 2018-07-20 NOTE — [facility] Notes (Signed)
D/c instructions discussed & given, to include prescription info, verification of f/u appts, & when to call the doctor.  Pt denies additional ques/concerns & is in stable condition & ready for d/c.

## 2018-07-31 ENCOUNTER — Ambulatory Visit (INDEPENDENT_AMBULATORY_CARE_PROVIDER_SITE_OTHER): Payer: Medicaid Other | Admitting: *Deleted

## 2018-07-31 VITALS — BP 119/73 | HR 88 | Ht 63.0 in | Wt 189.8 lb

## 2018-07-31 DIAGNOSIS — Z5189 Encounter for other specified aftercare: Secondary | ICD-10-CM

## 2018-07-31 NOTE — Progress Notes (Addendum)
Here for wound check. Had c/s 07/17/18.  Wound still covered by honeycomb dressing with old blood drainage. Removed dressing without difficulty. Incision clean , dry, intact with steristrips. Removed several loose steristrips and advised patient when she showers daily to remove 1-2 strips daily so that all are removed within a few days. Advised to keep wound clean and dry and to call us or to go to [facility] if any issues notes such as wound opening, redness, edema, discharge. Advised to keep pp appt already scheduled.  She voices understanding.  L. Laray AngerZeyfang,RN

## 2018-07-31 NOTE — [facility] Notes (Signed)
Patient seen and assessed by nursing staff.  Agree with documentation and plan.  

## 2018-08-28 ENCOUNTER — Other Ambulatory Visit (HOSPITAL_COMMUNITY)
Admission: RE | Admit: 2018-08-28 | Discharge: 2018-08-28 | Disposition: A | Discharge status: Home / Self Care | Payer: Medicaid Other | Source: Ambulatory Visit | Attending: Family Medicine | Admitting: Family Medicine

## 2018-08-28 ENCOUNTER — Ambulatory Visit (INDEPENDENT_AMBULATORY_CARE_PROVIDER_SITE_OTHER): Payer: Medicaid Other | Admitting: Family Medicine

## 2018-08-28 ENCOUNTER — Encounter: Payer: Self-pay | Admitting: Family Medicine

## 2018-08-28 VITALS — BP 129/86 | HR 98 | Ht 63.0 in | Wt 188.9 lb

## 2018-08-28 DIAGNOSIS — R87612 Low grade squamous intraepithelial lesion on cytologic smear of cervix (LGSIL): Secondary | ICD-10-CM | POA: Insufficient documentation

## 2018-08-28 DIAGNOSIS — Z1389 Encounter for screening for other disorder: Secondary | ICD-10-CM | POA: Diagnosis not present

## 2018-08-28 DIAGNOSIS — N870 Mild cervical dysplasia: Secondary | ICD-10-CM | POA: Diagnosis not present

## 2018-08-28 DIAGNOSIS — Z30430 Encounter for insertion of intrauterine contraceptive device: Secondary | ICD-10-CM

## 2018-08-28 DIAGNOSIS — Z975 Presence of (intrauterine) contraceptive device: Secondary | ICD-10-CM

## 2018-08-28 LAB — POCT PREGNANCY, URINE: Preg Test, Ur: NEGATIVE

## 2018-08-28 MED ORDER — LEVONORGESTREL 19.5 MCG/DAY IU IUD
INTRAUTERINE_SYSTEM | Freq: Once | INTRAUTERINE | Status: AC
  Administered 2018-08-28: 16:00:00 via INTRAUTERINE

## 2018-08-28 NOTE — Addendum Note (Signed)
Addended by: Reva BoresPRATT, TANYA S on: 08/28/2018 04:33 PM   Modules accepted: Orders

## 2018-08-28 NOTE — Progress Notes (Signed)
Subjective:     Zoe Keller is a 27 y.o. female who presents for a postpartum visit. She is 6 weeks postpartum following a low cervical transverse Cesarean section. I have fully reviewed the prenatal and intrapartum course. The delivery was at 686w6d gestational weeks. Outcome: primary cesarean section, low transverse incision. Anesthesia: epidural. Postpartum course has been uncomplicated. Baby's course has been unremarkable. Baby is feeding by bottle Daron Offer- [employer]. Bleeding moderate lochia continues though UPT is negative. May have had 1 cycle. Bowel function is normal. Bladder function is normal. Patient is not sexually active. Contraception method is none. Postpartum depression screening: negative. I have independently verified this information  The following portions of the patient's history were reviewed and updated as appropriate: allergies, current medications, past family history, past medical history, past social history, past surgical history and problem list.  Review of Systems Pertinent items noted in HPI and remainder of comprehensive ROS otherwise negative.   Objective:    BP 129/86   Pulse 98   Ht 5\' 3"  (1.6 m)   Wt 188 lb 14.4 oz (85.7 kg)   LMP 10/04/2017 (Exact Date)   Breastfeeding No   BMI 33.46 kg/m   General:  alert, cooperative and appears stated age  Lungs: normal effort  Heart:  regular rate and rhythm  Abdomen: soft, non-tender; bowel sounds normal; no masses,  no organomegaly        Procedure: Patient identified, informed consent performed, signed copy in chart, time out was performed.  Urine pregnancy test negative.  Speculum placed in the vagina.  Cervix visualized.  Cleaned with Betadine x 2.  Grasped anteriourly with a single tooth tenaculum.  Uterus sounded to 8 cm.  Liletta IUD placed per manufacturer's recommendations.  Strings trimmed to 3 cm.   Patient given post procedure instructions and Liletta care card with expiration date.    Procedure: Pap smear which showed LGSIL in 04/19. Cervix viewed with speculum and colposcope after application of acetic acid.   Colposcopy adequate?  no Acetowhite lesions? Could not visualize due to adherent mucous Punctation? ? Mosaicism?  ? Abnormal vasculature?  ? Biopsies? 12, 3, 6, 9 o'clock ECC? yes    Assessment:    Nml postpartum exam. Pap smear not done at today's visit.   Plan:    1. Contraception: IUD 2. S/p colpo 3. Follow up in: 4 weeks for IUD string check or as needed.

## 2018-08-28 NOTE — Assessment & Plan Note (Signed)
S/p colpo

## 2018-09-18 ENCOUNTER — Telehealth: Payer: Self-pay

## 2018-09-18 NOTE — Telephone Encounter (Signed)
Called pt to give results of Colposcopy, and to get another Pap in 1 year. Pt verbalized understanding.

## 2019-03-21 ENCOUNTER — Other Ambulatory Visit
Admission: RE | Admit: 2019-03-21 | Discharge: 2019-03-21 | Disposition: A | Discharge status: Home / Self Care | Payer: Medicaid Other | Source: Ambulatory Visit | Attending: Student in an Organized Health Care Education/Training Program | Admitting: Student in an Organized Health Care Education/Training Program

## 2019-03-21 ENCOUNTER — Other Ambulatory Visit: Payer: Self-pay

## 2019-03-21 DIAGNOSIS — Z01812 Encounter for preprocedural laboratory examination: Secondary | ICD-10-CM | POA: Diagnosis not present

## 2019-03-21 DIAGNOSIS — Z1159 Encounter for screening for other viral diseases: Secondary | ICD-10-CM | POA: Insufficient documentation

## 2019-03-22 LAB — SARS CORONAVIRUS 2 (TAT 6-24 HRS): SARS Coronavirus 2: NEGATIVE

## 2019-10-13 ENCOUNTER — Other Ambulatory Visit (HOSPITAL_COMMUNITY)
Admission: RE | Admit: 2019-10-13 | Discharge: 2019-10-13 | Disposition: A | Discharge status: Home / Self Care | Payer: 59 | Source: Ambulatory Visit | Attending: Family Medicine | Admitting: Family Medicine

## 2019-10-13 ENCOUNTER — Other Ambulatory Visit: Payer: Self-pay

## 2019-10-13 ENCOUNTER — Encounter: Payer: Self-pay | Admitting: Family Medicine

## 2019-10-13 ENCOUNTER — Ambulatory Visit (INDEPENDENT_AMBULATORY_CARE_PROVIDER_SITE_OTHER): Payer: 59 | Admitting: Family Medicine

## 2019-10-13 VITALS — BP 122/82 | HR 91 | Wt 179.2 lb

## 2019-10-13 DIAGNOSIS — Z124 Encounter for screening for malignant neoplasm of cervix: Secondary | ICD-10-CM | POA: Insufficient documentation

## 2019-10-13 DIAGNOSIS — R87612 Low grade squamous intraepithelial lesion on cytologic smear of cervix (LGSIL): Secondary | ICD-10-CM

## 2019-10-13 DIAGNOSIS — Z01419 Encounter for gynecological examination (general) (routine) without abnormal findings: Secondary | ICD-10-CM

## 2019-10-13 DIAGNOSIS — Z23 Encounter for immunization: Secondary | ICD-10-CM | POA: Diagnosis not present

## 2019-10-13 NOTE — [facility] Notes (Signed)
  Subjective:     Zoe Keller is a 29 y.o. female and is here for a comprehensive physical exam. The patient reports no problems. Has IUD in place. Occasional cycles only.  The following portions of the patient's history were reviewed and updated as appropriate: allergies, current medications, past family history, past medical history, past social history, past surgical history and problem list.  Review of Systems Pertinent items noted in HPI and remainder of comprehensive ROS otherwise negative.   Objective:    BP 122/82   Pulse 91   Wt 179 lb 3.2 oz (81.3 kg)   LMP 10/04/2019 (Exact Date)   BMI 31.74 kg/m  General appearance: alert, cooperative and appears stated age Head: Normocephalic, without obvious abnormality, atraumatic Neck: no adenopathy, supple, symmetrical, trachea midline and thyroid not enlarged, symmetric, no tenderness/mass/nodules Lungs: clear to auscultation bilaterally Heart: regular rate and rhythm, S1, S2 normal, no murmur, click, rub or gallop Abdomen: soft, non-tender; bowel sounds normal; no masses,  no organomegaly Pelvic: cervix normal in appearance, external genitalia normal, no adnexal masses or tenderness, no cervical motion tenderness, uterus normal size, shape, and consistency and vagina normal without discharge Extremities: extremities normal, atraumatic, no cyanosis or edema Pulses: 2+ and symmetric Skin: Skin color, texture, turgor normal. No rashes or lesions Lymph nodes: none obvious Neurologic: Grossly normal    Assessment:    Healthy female exam.      Plan:      Problem List Items Addressed This Visit      Unprioritized   Low grade squamous intraepithelial lesion (LGSIL) on cervical Pap smear    Repeat pap today--f/u prn.      Relevant Orders   Cytology - PAP( [place])    Other Visit Diagnoses    Need for influenza vaccination    -  Primary   Relevant Orders   Flu Vaccine QUAD 36+ mos IM   Screening for malignant  neoplasm of cervix       Relevant Orders   Cytology - PAP( [place])   Encounter for gynecological examination without abnormal finding       Relevant Medications   Multiple Vitamins-Minerals (MULTIPLE VITAMINS/WOMENS PO)      See After Visit Summary for Counseling Recommendations

## 2019-10-13 NOTE — Assessment & Plan Note (Signed)
Repeat pap today--f/u prn.

## 2019-10-13 NOTE — Patient Instructions (Signed)
 Preventive Care 21-28 Years Old, Female Preventive care refers to visits with your health care provider and lifestyle choices that can promote health and wellness. This includes:  A yearly physical exam. This may also be called an annual well check.  Regular dental visits and eye exams.  Immunizations.  Screening for certain conditions.  Healthy lifestyle choices, such as eating a healthy diet, getting regular exercise, not using drugs or products that contain nicotine and tobacco, and limiting alcohol use. What can I expect for my preventive care visit? Physical exam Your health care provider will check your:  Height and weight. This may be used to calculate body mass index (BMI), which tells if you are at a healthy weight.  Heart rate and blood pressure.  Skin for abnormal spots. Counseling Your health care provider may ask you questions about your:  Alcohol, tobacco, and drug use.  Emotional well-being.  Home and relationship well-being.  Sexual activity.  Eating habits.  Work and work environment.  Method of birth control.  Menstrual cycle.  Pregnancy history. What immunizations do I need?  Influenza (flu) vaccine  This is recommended every year. Tetanus, diphtheria, and pertussis (Tdap) vaccine  You may need a Td booster every 10 years. Varicella (chickenpox) vaccine  You may need this if you have not been vaccinated. Human papillomavirus (HPV) vaccine  If recommended by your health care provider, you may need three doses over 6 months. Measles, mumps, and rubella (MMR) vaccine  You may need at least one dose of MMR. You may also need a second dose. Meningococcal conjugate (MenACWY) vaccine  One dose is recommended if you are age 19-21 years and a first-year college student living in a residence hall, or if you have one of several medical conditions. You may also need additional booster doses. Pneumococcal conjugate (PCV13) vaccine  You may need  this if you have certain conditions and were not previously vaccinated. Pneumococcal polysaccharide (PPSV23) vaccine  You may need one or two doses if you smoke cigarettes or if you have certain conditions. Hepatitis A vaccine  You may need this if you have certain conditions or if you travel or work in places where you may be exposed to hepatitis A. Hepatitis B vaccine  You may need this if you have certain conditions or if you travel or work in places where you may be exposed to hepatitis B. Haemophilus influenzae type b (Hib) vaccine  You may need this if you have certain conditions. You may receive vaccines as individual doses or as more than one vaccine together in one shot (combination vaccines). Talk with your health care provider about the risks and benefits of combination vaccines. What tests do I need?  Blood tests  Lipid and cholesterol levels. These may be checked every 5 years starting at age 20.  Hepatitis C test.  Hepatitis B test. Screening  Diabetes screening. This is done by checking your blood sugar (glucose) after you have not eaten for a while (fasting).  Sexually transmitted disease (STD) testing.  BRCA-related cancer screening. This may be done if you have a family history of breast, ovarian, tubal, or peritoneal cancers.  Pelvic exam and Pap test. This may be done every 3 years starting at age 21. Starting at age 30, this may be done every 5 years if you have a Pap test in combination with an HPV test. Talk with your health care provider about your test results, treatment options, and if necessary, the need for more   tests. Follow these instructions at home: Eating and drinking   Eat a diet that includes fresh fruits and vegetables, whole grains, lean protein, and low-fat dairy.  Take vitamin and mineral supplements as recommended by your health care provider.  Do not drink alcohol if: ? Your health care provider tells you not to drink. ? You are  pregnant, may be pregnant, or are planning to become pregnant.  If you drink alcohol: ? Limit how much you have to 0-1 drink a day. ? Be aware of how much alcohol is in your drink. In the [place] one drink equals one 12 oz bottle of beer (355 mL), one 5 oz glass of wine (148 mL), or one 1 oz glass of hard liquor (44 mL). Lifestyle  Take daily care of your teeth and gums.  Stay active. Exercise for at least 30 minutes on 5 or more days each week.  Do not use any products that contain nicotine or tobacco, such as cigarettes, e-cigarettes, and chewing tobacco. If you need help quitting, ask your health care provider.  If you are sexually active, practice safe sex. Use a condom or other form of birth control (contraception) in order to prevent pregnancy and STIs (sexually transmitted infections). If you plan to become pregnant, see your health care provider for a preconception visit. What's next?  Visit your health care provider once a year for a well check visit.  Ask your health care provider how often you should have your eyes and teeth checked.  Stay up to date on all vaccines. This information is not intended to replace advice given to you by your health care provider. Make sure you discuss any questions you have with your health care provider. Document Revised: 05/09/2018 Document Reviewed: 05/09/2018 [employer] Patient Education  2020 [employer]  

## 2019-10-14 LAB — CYTOLOGY - PAP: Diagnosis: NEGATIVE
